# Patient Record
Sex: Female | Born: 1935 | Race: White | Hispanic: No | Smoking: Former smoker
Health system: Southern US, Community
[De-identification: ages and names within clinical notes are randomized; demographics above are authoritative.]

## PROBLEM LIST (undated history)

## (undated) DIAGNOSIS — E039 Hypothyroidism, unspecified: Secondary | ICD-10-CM

## (undated) DIAGNOSIS — I739 Peripheral vascular disease, unspecified: Secondary | ICD-10-CM

## (undated) DIAGNOSIS — G629 Polyneuropathy, unspecified: Secondary | ICD-10-CM

## (undated) DIAGNOSIS — E785 Hyperlipidemia, unspecified: Secondary | ICD-10-CM

## (undated) DIAGNOSIS — C3490 Malignant neoplasm of unspecified part of unspecified bronchus or lung: Secondary | ICD-10-CM

## (undated) DIAGNOSIS — Z9221 Personal history of antineoplastic chemotherapy: Secondary | ICD-10-CM

## (undated) DIAGNOSIS — C801 Malignant (primary) neoplasm, unspecified: Secondary | ICD-10-CM

## (undated) DIAGNOSIS — I10 Essential (primary) hypertension: Secondary | ICD-10-CM

## (undated) DIAGNOSIS — C50919 Malignant neoplasm of unspecified site of unspecified female breast: Secondary | ICD-10-CM

## (undated) DIAGNOSIS — Z923 Personal history of irradiation: Secondary | ICD-10-CM

## (undated) DIAGNOSIS — R918 Other nonspecific abnormal finding of lung field: Secondary | ICD-10-CM

## (undated) DIAGNOSIS — J3801 Paralysis of vocal cords and larynx, unilateral: Secondary | ICD-10-CM

## (undated) DIAGNOSIS — J449 Chronic obstructive pulmonary disease, unspecified: Secondary | ICD-10-CM

## (undated) HISTORY — DX: Other nonspecific abnormal finding of lung field: R91.8

## (undated) HISTORY — DX: Personal history of antineoplastic chemotherapy: Z92.21

## (undated) HISTORY — DX: Malignant neoplasm of unspecified site of unspecified female breast: C50.919

## (undated) HISTORY — DX: Malignant neoplasm of unspecified part of unspecified bronchus or lung: C34.90

## (undated) HISTORY — PX: NECK MASS EXCISION: SHX2079

## (undated) HISTORY — PX: THYROIDECTOMY: SHX17

## (undated) HISTORY — PX: ABDOMINAL HYSTERECTOMY: SHX81

## (undated) HISTORY — DX: Paralysis of vocal cords and larynx, unilateral: J38.01

## (undated) HISTORY — DX: Essential (primary) hypertension: I10

## (undated) HISTORY — DX: Peripheral vascular disease, unspecified: I73.9

## (undated) HISTORY — PX: MASTECTOMY: SHX3

## (undated) HISTORY — DX: Hyperlipidemia, unspecified: E78.5

## (undated) HISTORY — DX: Polyneuropathy, unspecified: G62.9

## (undated) HISTORY — PX: EXTERNAL EAR SURGERY: SHX627

## (undated) SURGERY — BRONCHOSCOPY, FLEXIBLE
Anesthesia: Moderate Sedation | Laterality: Bilateral

---

## 2000-07-06 ENCOUNTER — Encounter: Payer: Self-pay | Admitting: *Deleted

## 2000-07-06 ENCOUNTER — Ambulatory Visit (HOSPITAL_COMMUNITY): Admission: RE | Admit: 2000-07-06 | Discharge: 2000-07-06 | Payer: Self-pay | Admitting: *Deleted

## 2000-07-30 ENCOUNTER — Encounter: Payer: Self-pay | Admitting: General Surgery

## 2000-07-30 ENCOUNTER — Ambulatory Visit (HOSPITAL_COMMUNITY): Admission: RE | Admit: 2000-07-30 | Discharge: 2000-07-30 | Payer: Self-pay | Admitting: General Surgery

## 2000-08-15 ENCOUNTER — Encounter: Payer: Self-pay | Admitting: Internal Medicine

## 2000-08-15 ENCOUNTER — Ambulatory Visit (HOSPITAL_COMMUNITY): Admission: RE | Admit: 2000-08-15 | Discharge: 2000-08-15 | Payer: Self-pay | Admitting: Internal Medicine

## 2000-08-31 ENCOUNTER — Ambulatory Visit (HOSPITAL_COMMUNITY): Admission: RE | Admit: 2000-08-31 | Discharge: 2000-08-31 | Payer: Self-pay | Admitting: Internal Medicine

## 2001-02-21 ENCOUNTER — Other Ambulatory Visit: Admission: RE | Admit: 2001-02-21 | Discharge: 2001-02-21 | Payer: Self-pay | Admitting: General Surgery

## 2001-04-09 ENCOUNTER — Encounter: Payer: Self-pay | Admitting: Internal Medicine

## 2001-04-09 ENCOUNTER — Ambulatory Visit (HOSPITAL_COMMUNITY): Admission: RE | Admit: 2001-04-09 | Discharge: 2001-04-09 | Payer: Self-pay | Admitting: Internal Medicine

## 2001-04-11 ENCOUNTER — Other Ambulatory Visit: Admission: RE | Admit: 2001-04-11 | Discharge: 2001-04-11 | Payer: Self-pay | Admitting: General Surgery

## 2001-04-17 ENCOUNTER — Ambulatory Visit (HOSPITAL_COMMUNITY): Admission: RE | Admit: 2001-04-17 | Discharge: 2001-04-17 | Payer: Self-pay | Admitting: Internal Medicine

## 2001-04-17 ENCOUNTER — Encounter: Payer: Self-pay | Admitting: Internal Medicine

## 2001-04-25 ENCOUNTER — Encounter: Payer: Self-pay | Admitting: Internal Medicine

## 2001-04-25 ENCOUNTER — Ambulatory Visit (HOSPITAL_COMMUNITY): Admission: RE | Admit: 2001-04-25 | Discharge: 2001-04-25 | Payer: Self-pay | Admitting: Internal Medicine

## 2001-07-31 ENCOUNTER — Encounter: Payer: Self-pay | Admitting: General Surgery

## 2001-07-31 ENCOUNTER — Ambulatory Visit (HOSPITAL_COMMUNITY): Admission: RE | Admit: 2001-07-31 | Discharge: 2001-07-31 | Payer: Self-pay | Admitting: General Surgery

## 2001-10-15 ENCOUNTER — Other Ambulatory Visit: Admission: RE | Admit: 2001-10-15 | Discharge: 2001-10-15 | Payer: Self-pay | Admitting: General Surgery

## 2001-12-23 ENCOUNTER — Ambulatory Visit (HOSPITAL_COMMUNITY): Admission: RE | Admit: 2001-12-23 | Discharge: 2001-12-23 | Payer: Self-pay | Admitting: Internal Medicine

## 2001-12-23 ENCOUNTER — Encounter: Payer: Self-pay | Admitting: Internal Medicine

## 2001-12-26 ENCOUNTER — Encounter: Payer: Self-pay | Admitting: Family Medicine

## 2001-12-26 ENCOUNTER — Ambulatory Visit (HOSPITAL_COMMUNITY): Admission: RE | Admit: 2001-12-26 | Discharge: 2001-12-26 | Payer: Self-pay | Admitting: Family Medicine

## 2002-08-05 ENCOUNTER — Ambulatory Visit (HOSPITAL_COMMUNITY): Admission: RE | Admit: 2002-08-05 | Discharge: 2002-08-05 | Payer: Self-pay | Admitting: General Surgery

## 2002-08-05 ENCOUNTER — Encounter: Payer: Self-pay | Admitting: General Surgery

## 2002-08-14 ENCOUNTER — Encounter: Payer: Self-pay | Admitting: General Surgery

## 2002-08-14 ENCOUNTER — Ambulatory Visit (HOSPITAL_COMMUNITY): Admission: RE | Admit: 2002-08-14 | Discharge: 2002-08-14 | Payer: Self-pay | Admitting: General Surgery

## 2002-11-21 ENCOUNTER — Ambulatory Visit (HOSPITAL_COMMUNITY): Admission: RE | Admit: 2002-11-21 | Discharge: 2002-11-21 | Payer: Self-pay | Admitting: General Surgery

## 2002-11-21 ENCOUNTER — Encounter: Payer: Self-pay | Admitting: General Surgery

## 2003-01-28 ENCOUNTER — Ambulatory Visit (HOSPITAL_COMMUNITY): Admission: RE | Admit: 2003-01-28 | Discharge: 2003-01-28 | Payer: Self-pay | Admitting: Internal Medicine

## 2003-02-18 ENCOUNTER — Ambulatory Visit (HOSPITAL_COMMUNITY): Admission: RE | Admit: 2003-02-18 | Discharge: 2003-02-18 | Payer: Self-pay | Admitting: General Surgery

## 2003-10-06 ENCOUNTER — Ambulatory Visit (HOSPITAL_COMMUNITY): Admission: RE | Admit: 2003-10-06 | Discharge: 2003-10-06 | Payer: Self-pay | Admitting: Internal Medicine

## 2003-11-04 ENCOUNTER — Ambulatory Visit (HOSPITAL_COMMUNITY): Admission: RE | Admit: 2003-11-04 | Discharge: 2003-11-04 | Payer: Self-pay | Admitting: Internal Medicine

## 2003-11-13 ENCOUNTER — Ambulatory Visit (HOSPITAL_COMMUNITY): Admission: RE | Admit: 2003-11-13 | Discharge: 2003-11-13 | Payer: Self-pay | Admitting: Internal Medicine

## 2003-11-23 ENCOUNTER — Ambulatory Visit (HOSPITAL_COMMUNITY): Admission: RE | Admit: 2003-11-23 | Discharge: 2003-11-23 | Payer: Self-pay | Admitting: Internal Medicine

## 2004-01-22 ENCOUNTER — Observation Stay (HOSPITAL_COMMUNITY): Admission: RE | Admit: 2004-01-22 | Discharge: 2004-01-23 | Payer: Self-pay | Admitting: General Surgery

## 2004-04-07 ENCOUNTER — Ambulatory Visit (HOSPITAL_COMMUNITY): Admission: RE | Admit: 2004-04-07 | Discharge: 2004-04-07 | Payer: Self-pay | Admitting: Internal Medicine

## 2004-04-27 ENCOUNTER — Observation Stay (HOSPITAL_COMMUNITY): Admission: RE | Admit: 2004-04-27 | Discharge: 2004-04-28 | Payer: Self-pay | Admitting: General Surgery

## 2004-06-03 HISTORY — PX: DOPPLER ECHOCARDIOGRAPHY: SHX263

## 2005-09-22 ENCOUNTER — Ambulatory Visit (HOSPITAL_COMMUNITY): Admission: RE | Admit: 2005-09-22 | Discharge: 2005-09-22 | Payer: Self-pay | Admitting: General Surgery

## 2006-09-25 ENCOUNTER — Ambulatory Visit (HOSPITAL_COMMUNITY): Admission: RE | Admit: 2006-09-25 | Discharge: 2006-09-25 | Payer: Self-pay | Admitting: General Surgery

## 2006-11-20 ENCOUNTER — Ambulatory Visit (HOSPITAL_COMMUNITY): Admission: RE | Admit: 2006-11-20 | Discharge: 2006-11-20 | Payer: Self-pay | Admitting: Family Medicine

## 2007-03-14 HISTORY — PX: FEMORAL ARTERY STENT: SHX1583

## 2007-04-17 ENCOUNTER — Ambulatory Visit (HOSPITAL_COMMUNITY): Admission: RE | Admit: 2007-04-17 | Discharge: 2007-04-17 | Payer: Self-pay | Admitting: Internal Medicine

## 2007-05-01 ENCOUNTER — Ambulatory Visit (HOSPITAL_COMMUNITY): Admission: RE | Admit: 2007-05-01 | Discharge: 2007-05-01 | Payer: Self-pay | Admitting: Family Medicine

## 2007-10-17 ENCOUNTER — Ambulatory Visit (HOSPITAL_COMMUNITY): Admission: RE | Admit: 2007-10-17 | Discharge: 2007-10-17 | Payer: Self-pay | Admitting: General Surgery

## 2007-12-17 ENCOUNTER — Ambulatory Visit (HOSPITAL_COMMUNITY): Admission: RE | Admit: 2007-12-17 | Discharge: 2007-12-17 | Payer: Self-pay | Admitting: Family Medicine

## 2008-01-10 HISTORY — PX: NM MYOCAR PERF WALL MOTION: HXRAD629

## 2008-01-27 ENCOUNTER — Ambulatory Visit (HOSPITAL_COMMUNITY): Admission: RE | Admit: 2008-01-27 | Discharge: 2008-01-27 | Payer: Self-pay | Admitting: *Deleted

## 2008-02-03 ENCOUNTER — Inpatient Hospital Stay (HOSPITAL_COMMUNITY): Admission: RE | Admit: 2008-02-03 | Discharge: 2008-02-04 | Payer: Self-pay | Admitting: Cardiovascular Disease

## 2008-02-03 HISTORY — PX: LOWER EXTREMITY ANGIOGRAM: SHX5955

## 2008-10-27 ENCOUNTER — Ambulatory Visit (HOSPITAL_COMMUNITY): Admission: RE | Admit: 2008-10-27 | Discharge: 2008-10-27 | Payer: Self-pay | Admitting: Otolaryngology

## 2008-10-27 ENCOUNTER — Ambulatory Visit (HOSPITAL_COMMUNITY): Admission: RE | Admit: 2008-10-27 | Discharge: 2008-10-27 | Payer: Self-pay | Admitting: Hematology and Oncology

## 2009-04-20 ENCOUNTER — Ambulatory Visit (HOSPITAL_COMMUNITY): Admission: RE | Admit: 2009-04-20 | Discharge: 2009-04-20 | Payer: Self-pay | Admitting: Ophthalmology

## 2009-05-04 ENCOUNTER — Ambulatory Visit (HOSPITAL_COMMUNITY): Admission: RE | Admit: 2009-05-04 | Discharge: 2009-05-04 | Payer: Self-pay | Admitting: Ophthalmology

## 2009-07-12 ENCOUNTER — Inpatient Hospital Stay (HOSPITAL_COMMUNITY): Admission: EM | Admit: 2009-07-12 | Discharge: 2009-07-13 | Payer: Self-pay | Admitting: Emergency Medicine

## 2009-11-01 ENCOUNTER — Ambulatory Visit (HOSPITAL_COMMUNITY): Admission: RE | Admit: 2009-11-01 | Discharge: 2009-11-01 | Payer: Self-pay | Admitting: Family Medicine

## 2010-04-03 ENCOUNTER — Encounter: Payer: Self-pay | Admitting: General Surgery

## 2010-05-31 LAB — CULTURE, BLOOD (ROUTINE X 2)
Culture: NO GROWTH
Culture: NO GROWTH
Report Status: 5072011

## 2010-05-31 LAB — CBC
Hemoglobin: 11.6 g/dL — ABNORMAL LOW (ref 12.0–15.0)
Hemoglobin: 14.4 g/dL (ref 12.0–15.0)
MCHC: 34.2 g/dL (ref 30.0–36.0)
MCV: 87.7 fL (ref 78.0–100.0)
RBC: 3.82 MIL/uL — ABNORMAL LOW (ref 3.87–5.11)
RBC: 4.81 MIL/uL (ref 3.87–5.11)
WBC: 5.4 10*3/uL (ref 4.0–10.5)
WBC: 9 10*3/uL (ref 4.0–10.5)

## 2010-05-31 LAB — BASIC METABOLIC PANEL
BUN: 11 mg/dL (ref 6–23)
BUN: 13 mg/dL (ref 6–23)
CO2: 23 mEq/L (ref 19–32)
CO2: 24 mEq/L (ref 19–32)
Calcium: 10.7 mg/dL — ABNORMAL HIGH (ref 8.4–10.5)
Calcium: 9.2 mg/dL (ref 8.4–10.5)
Creatinine, Ser: 1.11 mg/dL (ref 0.4–1.2)
Potassium: 3.3 mEq/L — ABNORMAL LOW (ref 3.5–5.1)
Sodium: 137 mEq/L (ref 135–145)

## 2010-05-31 LAB — DIFFERENTIAL
Basophils Relative: 0 % (ref 0–1)
Basophils Relative: 0 % (ref 0–1)
Eosinophils Absolute: 0.2 10*3/uL (ref 0.0–0.7)
Eosinophils Relative: 0 % (ref 0–5)
Lymphs Abs: 0.6 10*3/uL — ABNORMAL LOW (ref 0.7–4.0)
Lymphs Abs: 1.5 10*3/uL (ref 0.7–4.0)
Neutro Abs: 8 10*3/uL — ABNORMAL HIGH (ref 1.7–7.7)
Neutrophils Relative %: 89 % — ABNORMAL HIGH (ref 43–77)

## 2010-05-31 LAB — URINE CULTURE
Colony Count: NO GROWTH
Culture: NO GROWTH

## 2010-05-31 LAB — URINALYSIS, ROUTINE W REFLEX MICROSCOPIC
Glucose, UA: NEGATIVE mg/dL
Ketones, ur: NEGATIVE mg/dL
Leukocytes, UA: NEGATIVE
Protein, ur: 30 mg/dL — AB
pH: 6.5 (ref 5.0–8.0)

## 2010-05-31 LAB — HEMOGLOBIN AND HEMATOCRIT, BLOOD
HCT: 35 % — ABNORMAL LOW (ref 36.0–46.0)
Hemoglobin: 12.1 g/dL (ref 12.0–15.0)

## 2010-05-31 LAB — POCT CARDIAC MARKERS
CKMB, poc: <1 ng/mL — ABNORMAL LOW (ref 1.0–8.0)
Myoglobin, poc: 69.2 ng/mL (ref 12–200)

## 2010-05-31 LAB — URINE MICROSCOPIC-ADD ON

## 2010-06-01 LAB — BASIC METABOLIC PANEL
BUN: 11 mg/dL (ref 6–23)
Chloride: 104 mEq/L (ref 96–112)
Creatinine, Ser: 0.75 mg/dL (ref 0.4–1.2)

## 2010-06-01 LAB — HEMOGLOBIN AND HEMATOCRIT, BLOOD: Hemoglobin: 12.6 g/dL (ref 12.0–15.0)

## 2010-07-07 ENCOUNTER — Other Ambulatory Visit (HOSPITAL_COMMUNITY): Payer: Self-pay | Admitting: Family Medicine

## 2010-07-07 ENCOUNTER — Encounter (HOSPITAL_COMMUNITY): Payer: Self-pay

## 2010-07-07 ENCOUNTER — Ambulatory Visit (HOSPITAL_COMMUNITY)
Admission: RE | Admit: 2010-07-07 | Discharge: 2010-07-07 | Disposition: A | Discharge status: Home / Self Care | Payer: Medicare Other | Source: Ambulatory Visit | Attending: Family Medicine | Admitting: Family Medicine

## 2010-07-07 DIAGNOSIS — R5383 Other fatigue: Secondary | ICD-10-CM | POA: Insufficient documentation

## 2010-07-07 DIAGNOSIS — R5381 Other malaise: Secondary | ICD-10-CM

## 2010-07-07 DIAGNOSIS — E785 Hyperlipidemia, unspecified: Secondary | ICD-10-CM

## 2010-07-07 DIAGNOSIS — R0602 Shortness of breath: Secondary | ICD-10-CM | POA: Insufficient documentation

## 2010-07-07 DIAGNOSIS — E119 Type 2 diabetes mellitus without complications: Secondary | ICD-10-CM

## 2010-07-26 NOTE — Cardiovascular Report (Signed)
NAMEDASHAE, WILCHER NO.:  [id number]   MEDICAL RECORD NO.:  [id number]          PATIENT TYPE:  INP   LOCATION:  6527                         FACILITY:  [facility]   PHYSICIAN:  Nanetta Batty, M.D.   DATE OF BIRTH:  [birth year]   DATE OF PROCEDURE:  DATE OF DISCHARGE:                            CARDIAC CATHETERIZATION   Ms. Nathanson is a delightful 77 year old Caucasian female referred to me by  Dr. Phillips Odor for claudication.  She has hypertension, hyperlipidemia, and  a family history of heart disease.  She had Dopplers performed at [facility] showed a right ABI of 0.84 with suggestion of iliac  disease.  Myoview stress test and 2-D echo were normal.  She presents  now for angiography and potential intervention.   DESCRIPTION OF PROCEDURE:  The patient was brought to the Second Floor  of [facility] in the postabsorptive state.  She was  premedicated with p.o. Valium and IV fentanyl.  Her left groin was  prepped and draped in the usual sterile fashion.  A 1% Xylocaine was  used for local anesthesia.  A 5-French sheath was inserted into left  femoral artery using standard Seldinger technique.  A 5-French tennis  racket catheter was used for abdominal aortography with bifemoral runoff  using digital subtraction bolus chase step table technique.  Visipaque  dye was used for the entirety of the case.  Retrograde aortic pressures  were monitored during the case.   ANGIOGRAPHIC RESULTS:  1. Abdominal aorta;      a.     Renal arteries - normal.      b.     Infrarenal abdominal aorta; moderate atherosclerotic       changes.  2. Left lower extremity; normal and free of significant disease.  3. Right lower extremity;      a.     95% eccentric ostial right common iliac artery stenosis with       three-vessel runoff.   DESCRIPTION OF PROCEDURE:  A 6-French 30-cm long Cordis Brite Tip sheath  was inserted into the right femoral artery using standard  Seldinger  technique.  The patient received 2500 units of heparin intravenously.  Using a 0.035 wire and 7 x 18 Genesis and OPTA balloon stent premount,  primary stenting was performed of the ostium of the right common iliac  artery.  This was then postdilated with an 82 PowerFlex up to 10  atmospheres resulting reduction of a 95% eccentric ostial right common  iliac artery stenosis to less than 10% residual.  There was mild  overhang into the origin of the left common iliac artery.  However,  this was only 1-2 mm and I felt acceptable, not requiring intervention  on the left.   IMPRESSION:  Successful PTA and stenting of a high-grade ostial right  common iliac artery stenosis with Genesis balloon expandable stent for  symptom limiting claudication.  The patient tolerated the procedure  well.  ACT was measured and both sheaths were removed.  Pressure was  used on the groin to achieve hemostasis.  The patient  left  the lab in stable condition.  She will be gently hydrated, discharged  home in the morning.  She will get followup Dopplers and ABIs in the  office when we see her back for further evaluation and followup.  Dr. Geanie Cooley was notified of these results.      Nanetta Batty, M.D.  Electronically Signed     JB/MEDQ  D:  02/03/2008  T:  02/04/2008  Job:  [id number]   cc:   Corrie Mckusick, M.D.  Second Floor of [facility]  [facility]

## 2010-07-26 NOTE — Discharge Summary (Signed)
NAMEFRANCESKA, [name] NO.:  [id number]   MEDICAL RECORD NO.:  [id number]          PATIENT TYPE:  INP   LOCATION:  6527                         FACILITY:  [facility]   PHYSICIAN:  Nanetta Batty, M.D.   DATE OF BIRTH:  [birth year]   DATE OF ADMISSION:  02/03/2008  DATE OF DISCHARGE:  02/04/2008                               DISCHARGE SUMMARY   DISCHARGE DIAGNOSES:  1. Peripheral vascular disease with claudication, status post      percutaneous transluminal angioplasty and stenting of the right      common iliac artery, this admission.  2. History of low-risk Myoview and essentially normal echocardiogram      as an outpatient.  3. Remote vancomycin.  4. History of breast cancer status post chemotherapy in the past.  5. Treated hypertension.  6. Treated dyslipidemia.  7. Gastrointestinal bleeding and arteriovenous malformations, even on      81 mg of aspirin in the past.   HOSPITAL COURSE:  Ms. Shedrick is a pleasant 77 year old female who had  actually been seen by Dr. Tresa Endo some years ago.  She quit smoking 15  years ago.  She worked in a Medical laboratory scientific officer for 20 years.  She had breast  cancer treated with chemotherapy.  She has had some GI bleeding, which  she was told secondary to AVMs in her stomach.  She says Dr. Tresa Endo had  tried her on baby aspirin and she bled on that as well, so she does not  take aspirin now.  Recently, she was referred to Dr. Allyson Sabal for  claudication.  He saw her in the office on January 07, 2008.  Dopplers  at [facility] revealed an ABI of 0.84 on the right.  Carotid suggested  mild to moderate disease, 50%.  She was admitted for elective angiogram.  This was done on February 03, 2008.  She had a normal left system with  95% ostia of right common iliac artery and 3-vessel run off.  She  underwent PTA and stenting with good results.  We felt that she can be  discharged on February 04, 2008.   LABORATORY DATA:  Sodium 139, potassium 4.0, BUN 11, and  creatinine  0.88.  White count 6.3, hemoglobin 12.2, hematocrit 35.1, and platelets  163.   DISCHARGE MEDICATIONS:  1. Zocor 80 mg a day.  2. Prevacid 30 mg, we have asked her to now take this at night.  3. Plavix 75 mg, which she will take in the morning.  4. Synthroid 0.05 mg a day.  5. Fish oil 1 g a day.  6. Vitamin D.   DISPOSITION:  The patient is discharged in stable condition.  She will  talk to her doctor about her Prevacid, she states she is having trouble  affording this.  I suggested strongly that she stay on PPI or an H2  blocker and she could work this out with her family doctor.  If she does  stay on a PPI, we have been recommending that they be taken 12 hours  apart from her Plavix.  Abelino Derrick, P.A.      Nanetta Batty, M.D.  Electronically Signed    LKK/MEDQ  D:  02/04/2008  T:  02/05/2008  Job:  [id number]   cc:   Corrie Mckusick, M.D.

## 2010-07-29 NOTE — Op Note (Signed)
NAMEKYLEN, ISMAEL NO.:  [id number]   MEDICAL RECORD NO.:  [id number]          PATIENT TYPE:  AMB   LOCATION:  DAY                           FACILITY:  [facility]   PHYSICIAN:  Barbaraann Barthel, M.D. DATE OF BIRTH:  1933/11/02   DATE OF PROCEDURE:  04/27/2004  DATE OF DISCHARGE:                                 OPERATIVE REPORT   DIAGNOSIS:  Hyperparathyroidism.   PROCEDURE:  Left thyroid lobectomy.   NOTE:  This is a 77 year old white female who had hypercalcemia and a  hyperparathyroid hormone elevation, who underwent months ago and a left  lower  parathyroid adenoma removal.  She, however, on rescanning her  she  continued to have hypercalcemia, was noted to have the same images before  with findings still of the left lower lobe pole of the thyroid.  We  discussed this with the endocrinologist and planned for a left thyroid  lobectomy to remove this.  Also discussed this preoperatively with the  pathologist and planned for an excision for permanent section as this would  be the removal of the entire left lobe of the thyroid.  Informed consent was  obtained.  We discussed complications not limited to but including bleeding,  infection, problems with calcium metabolism and the possibility that further  surgery might be required, as well as discussed the possible damage to the  recurrent laryngeal nerve.  Informed consent was obtained.   SPECIMEN:  Left thyroid lobe.   GROSS OPERATIVE FINDINGS:  The patient had some scarring from the previous  surgery.  She had a very atrophic thyroid gland.  She has hypothyroidism and  has been on thyroid replacement for some time.  We identified the superior  and the lower pole of thyroid.  We stayed medial to avoid any damage to the  recurrent laryngeal nerve.  There were no abnormalities encountered.  A  superior parathyroid gland was identified and this was within the thyroid  specimen, and a portion of it remainder  and a  portion of it was removed  with the specimen.  The specimen was marked with a silk suture for the  superior pole of the thyroid gland.   TECHNIQUE:  The patient was placed in supine position with the patient in  some hyperextension, and incision was carried out over the previous collar  incision.  Superior and inferior flaps were raised above the thyroid  cartilage into the manubrium and then the with careful dissection, the strap  muscles were dissected free from the thyroid gland and retracted laterally  with a Green retractor.  I did not feel it necessary to divide this.  This  was a small, thin neck with a very small, atrophic thyroid gland.  The superior lobe was identified.  This was ligated with 2-0 silk and  clipped as well as was the inferior pole of the thyroid gland.  We then  swept the gland from medial to lateral carefully to remain high up on the  gland to avoid any problems with the recurrent laryngeal nerve.  We ligated  different branches  of the superior thyroid artery as it entered the thyroid  gland with 2-0 and 4-0 silk as needed.  After removing this and checking for  hemostasis, this was sent in formalin as previously discussed with the  pathologist.  We checked for hemostasis.  The wound was then irrigated.  The  midline incision was closed with a 3-0 Polysorb suture.  I elected to leave  a Jackson-Pratt drain that exited through the right portion of the neck  through the midline cervical fascia closure.  The platysma and the  subcutaneous layer was closed with 4-0 Polysorb, and clips were used  approximate the skin.  The drain was sutured in place with 4-0 nylon.  Prior to closure all sponge,  needle counts were found to be correct.  Estimated blood loss minimal.  The  patient received 900 mL crystalloids intraoperatively.  There were no complications.      WB/MEDQ  D:  04/27/2004  T:  04/27/2004  Job:  [id number]   cc:   Sherrie Mustache, M.D.   Madelin Rear.  Sherwood Gambler, MD  [address]  [place]  [place] [address]  Fax: [phone]

## 2010-07-29 NOTE — Op Note (Signed)
NAME:  [name], [name]                           ACCOUNT NO.:  [id number]   MEDICAL RECORD NO.:  [id number]                   PATIENT TYPE:  AMB   LOCATION:  DAY                                  FACILITY:  [facility]   PHYSICIAN:  Lionel December, M.D.                 DATE OF BIRTH:  [birth year]   DATE OF PROCEDURE:  11/04/2003  DATE OF DISCHARGE:                                 OPERATIVE REPORT   PROCEDURE:  Esophagogastroduodenoscopy with esophageal dilation.   INDICATIONS:  [name] is a 77 year old Caucasian female with chronic GERD who  has been maintained on PPI who is having solid food dysphagia. She also  complains of frequent need to clear her throat. She is undergoing diagnostic  and therapeutic EKG. Procedure risks were reviewed with the patient and  informed consent was obtained.   PREOPERATIVE MEDICATIONS:  Cetacaine spray for pharyngeal topical  anesthesia, Demerol 25 mg IV, Versed 5 IV mg in divided doses.   FINDINGS:  Procedure performed in endoscopy suite. The patient's vital signs  and O2 saturations were monitored during the procedure and remained stable.  The patient was placed in left lateral position, and Olympus video scope was  passed via oropharynx without any difficulty into the esophagus.   Esophagus:  Mucosa of the esophagus normal. There was a noncritical ring at  the GE junction, 3-cm sized sliding hiatal hernia. Gastric mucosa of the Z  line was erythematous.   Stomach:  It was empty and distended very well with insufflation. Folds of  the proximal stomach were normal. Examination of the mucosa revealed some  antral erythema and scarring but no erosions or ulcers noted. Pyloric  channel was patent. Angularis, fundus and cardia were examined by  retroflexing the scope and were normal.   Duodenum:  Examination of the bulb reveals normal mucosa. Scope was passed  to the second part of the duodenum, and mucosa and folds were normal.  Endoscope was withdrawn.   Esophagus was dilated by passing 56-French Maloney dilator to full  insertion. As the dilator was withdrawn, endoscope was passed again, and the  ring was still intact and therefore disrupted with focal biopsy at three  different sites. Pictures taken for the record. Endoscope was withdrawn. The  patient tolerated the procedure well.   FINAL DIAGNOSES:  1. Noncritical distal esophageal ring and a small sliding hiatal hernia.     Esophagus dilated by passing 56-French Maloney dilator but ring was still     intact and therefore disrupted with focal biopsy.  2. Antrum erythema with scarring but no evidence of peptic ulcer disease.     Please note that she has previously been treated for helicobacter pylori.   RECOMMENDATIONS:  She will continue anti-reflux measures and Prevacid as  before. She will call the office with full progress report next week.      ___________________________________________  Lionel December, M.D.   NR/MEDQ  D:  11/04/2003  T:  11/04/2003  Job:  [id number]   cc:   Madelin Rear. Sherwood Gambler, M.D.  [address]  [place]  [place] [address]  Fax: [phone]

## 2010-07-29 NOTE — H&P (Signed)
NAME:  [name], [name]                           ACCOUNT NO.:  [id number]   MEDICAL RECORD NO.:  [id number]                   PATIENT TYPE:   LOCATION:                                       FACILITY:   PHYSICIAN:  Lionel December, M.D.                 DATE OF BIRTH:  1933-07-31   DATE OF ADMISSION:  DATE OF DISCHARGE:                                HISTORY & PHYSICAL   REASON FOR ADMISSION:  Dysphagia, frequent throat clearing, for EGD.   HISTORY OF PRESENT ILLNESS:  [name] is a 77 year old Caucasian female,  patient of [name], who was last seen October 2002. She has history of  GI bleed secondary to AVM as well as chronic gastroesophageal reflux  disease. She notes over the last 2 or 3 months frequent throat clearing as  well as some dysphonia including laryngitis. She is also complaining of  dysphagia with some coughing to both solids and liquids. She denies any  odynophagia. She also reports occasional heartburn and indigestion. She has  been on PPI therapy for at least 10 years that she can remember. She is  currently on Prevacid 30 mg daily. She denies any nausea or vomiting. She  denies any lower abdominal pain. She reports her bowel movements have been  normal, soft, and brown without any melena or rectal bleeding. On August 31, 2000, she underwent colonoscopy and EGD by [name]. She was found to have  antral gastritis with 3 AVMs at the duodenum, 1 at the bulb and 2 at the  second part of the duodenum. They were ablated with the help of heater  probe. Colonoscopy revealed a single cecal erosion that was otherwise  normal. She was found to be helicobacter pylori positive. She is status post  triple drug therapy. She did have significant anemia with hemoglobin in the  7 range with this episode.   PAST MEDICAL HISTORY:  1. Stage 1 infiltrating ductal carcinoma, ER/PR negative, status post     modified radical mastectomy. She completed chemotherapy as well.  2. She does  have history of syncope in the past of unknown etiology.  3. She has had multiple ear surgeries including tympanoplasty as well as     several mastoid surgeries.  4. Modified right radial mastectomy.  5. Hysterectomy.  6. Peptic ulcer disease.  7. AVMs as described in the HPI.  8. Benign right sided neck tumor.  9. Carpal tunnel release on the right.  10.      Hyperlipidemia.  11.      Erosive reflux esophagitis.  12.      Small hiatal hernia.  13.      Hypothyroidism diagnosed in 2002 currently on Synthroid.  14.      Tonsillectomy.   CURRENT MEDICATIONS:  1. Synthroid 50 mcg daily.  2. Prevacid 30 mg daily.  3. Zocor 20 mg daily.  ALLERGIES:  She does not take ASPIRIN given her history of peptic ulcer  disease. SULFA causes swelling, and NEOSPORIN causes rash.   FAMILY HISTORY:  Both mother age 62 and father age 30 deceased secondary to  coronary artery disease. She had total of 17 siblings with history  significant for liver disease and breast carcinoma as well as two brothers  with gastric carcinoma.   SOCIAL HISTORY:  [name] is divorced and lives alone. She is retired from  [employer]. She denies any alcohol or drug use. She does report 20-pack-year  tobacco use history, quitting 14 years ago.   REVIEW OF SYSTEMS:  CONSTITUTIONAL:  Weight stable. Appetite is okay. Denies  any fever or chills. CARDIOVASCULAR:  She is followed by [name]. Denies  any chest pain or palpitations at this time. PULMONARY:  She does have  frequent nonproductive cough. Denies any shortness of breath or dyspnea on  exertion. MUSCULOSKELETAL:  She is complaining of bony pain has recently  underwent a bone scan per her report given her history of breast carcinoma.  NEUROLOGICAL:  She does report tremors, especially of her head, in the last  several years although she denies any headache or dizziness. She does report  occasional vertigo, especially when she makes quick movements and feels that   this is given the fact that she has had multiple ear surgeries.  GASTROINTESTINAL:  See HPI.   PHYSICAL EXAMINATION:  VITAL SIGNS:  Weight 153 pounds, blood pressure  110/78, pulse 74.  GENERAL:  [name] is a 77 year old, well-developed, well-nourished,  Caucasian female in no acute distress. She is alert, oriented, pleasant, and  cooperative.  HEENT:  Sclerae are clear and nonicteric. Conjunctivae pink. Oropharynx pink  and moist without any lesions.  NECK:  Supple without any mass or thyromegaly.  HEART:  Regular rate and rhythm without any murmurs, clicks, rubs, or  gallops.  LUNGS:  Clear to auscultation bilaterally.  ABDOMEN:  Positive bowel sounds x4. No bruits auscultated. Soft, nontender,  nondistended, without palpable mass or hepatosplenomegaly. No rebound  tenderness or guarding.  RECTAL:  Deferred.  EXTREMITIES:  Good pedal pulses bilaterally. No edema.  SKIN:  Pink, warm, and dry without any rash or jaundice.   ASSESSMENT:  [name] is a 77 year old Caucasian female with history of  chronic GERD who now presents with frequent throat clearing as well as  dysphagia to both solids and liquids and frequent episodes of coughing  spells. She does have significant history of GI bleeding with history of  both peptic ulcer disease as well as AVMs. She is status post triple drug  therapy for positive helicobacter pylori. I feel that her upper GI tract  should be reassessed at this time as she may be having refractory GERD  symptoms. Her dysphagia may be caused by ring or stricture, although  ultimately esophageal carcinoma needs to be ruled out given her symptoms.   RECOMMENDATIONS:  1. Will schedule EGD with possible esophageal dilatation in the near future.     I have discussed this procedure including risks and benefits which     include but are not limited to bleeding, infection, perforation, and drug    reaction. She agrees with this plan, and consent will be obtained.   2. She is to continue Prevacid 30 mg daily for now although this ultimately     may need to be changed if it is poorly     controlled reflux.  3. Further recommendations pending  procedure.   We would like to thank Dr. Sherwood Gambler for allowing [place] to participate in the care  of Ms. Minteer.     _____________________________________  ___________________________________________  Nicholas Lose, N.P.               Lionel December, M.D.   KC/MEDQ  D:  10/28/2003  T:  10/28/2003  Job:  [id number]   cc:   Madelin Rear. Sherwood Gambler, M.D.  [address]  [place]  [place] [address]  Fax: [phone]

## 2010-07-29 NOTE — Op Note (Signed)
NAMECURRY, DULSKI NO.:  [id number]   MEDICAL RECORD NO.:  [id number]          PATIENT TYPE:  OBV   LOCATION:  A302                          FACILITY:  [facility]   PHYSICIAN:  Barbaraann Barthel, M.D. DATE OF BIRTH:  1933-08-02   DATE OF PROCEDURE:  01/22/2004  DATE OF DISCHARGE:                                 OPERATIVE REPORT   PREOPERATIVE DIAGNOSIS:  Hyperparathyroidism secondary to left lower  parathyroid adenoma.   POSTOPERATIVE DIAGNOSIS:  Hyperparathyroidism secondary to left lower  parathyroid adenoma.   SURGEON:  Barbaraann Barthel, M.D.   ASSISTANT:  Dirk Dress. Katrinka Blazing, M.D.   NOTE:  This is a 77 year old white female who had elevated calcium levels,  was worked up by the endocrine service and found on subtraction scan to have  left-sided findings suggestive of a parathyroid adenoma. She had also an  elevated parathyroid hormone level.  Calcium levels preoperatively were just  above normal, and her calcium level preoperatively was 10.8.  Phosphorus  level was within normal limits.   GROSS OPERATIVE FINDINGS:  The patient had a parathyroid adenoma that  appeared to be on the left lower pole of the thyroid.  We explored this very  carefully.  We marked the superior parathyroid gland as well with a clip and  removed what we thought was the parathyroid adenoma.  This was sent for  frozen section at an adjacent hospital, as our pathologist was not  available.   We also explored the contralateral side and identified what appeared to be  parathyroid glands on this side as well.  We marked with a clip the area of  the superior parathyroid gland on the left side and marked with a clip the  area where we removed the parathyroid adenoma on the left side as well.  The  thyroid gland itself was atrophic.  There was no accompanying cervical  adenopathy.   TECHNIQUE:  The patient was placed in the supine position with  hyperextension of the neck.  Her neck was placed  on a bean bag, carefully  supporting the cervical spine.  The neck area from the chin down to the  superior chest was prepped with Betadine solution and draped in the usual  manner.  Cloth drapes were used and sutured in place, and an incision, a  collar incision was made about two fingerbreadths above the manubrium.  Skin, subcutaneous tissue, and the platysmal layer were incised and superior  and inferior flaps were raised.  The cervical fascia was then opened in the  midline and then we dissected the strap muscles free on the left side,  elevating these in order to allow exploration of the gland.  We encountered  the inferior parathyroid artery and around this area there was what appeared  to be parathyroid tissue, and this was clipped and sent for frozen section.  We also examined above what appeared to be a parathyroid gland superiorly  and clipped its position as well.  We explored carefully the area around the  lower pole elevating the thyroid gland and retracting it medially and  as  well as dissecting down in the prethymic fat area.  We did not find any  other abnormalities in that area and after this was accomplished, we then  closed the fascia in the midline, placing a Penrose drain below the left  strap muscles.  Skin was approximated with  stapling device after suturing the drain in place.  As stated, we identified  the areas of the parathyroid on the left side with clips as well as  carefully identified the recurrent laryngeal nerve on this side, which I  neglected to mention.   CC: __________     Marsh Dolly  D:  01/22/2004  T:  01/22/2004  Job:  [id number]   cc:   Dr. Drue Novel C. Katrinka Blazing, M.D.  [address]  [place]  [place] [address]  Fax: [phone]

## 2010-09-26 ENCOUNTER — Other Ambulatory Visit (HOSPITAL_COMMUNITY): Payer: Self-pay | Admitting: General Surgery

## 2010-09-26 DIAGNOSIS — Z139 Encounter for screening, unspecified: Secondary | ICD-10-CM

## 2010-11-07 ENCOUNTER — Ambulatory Visit (HOSPITAL_COMMUNITY)
Admission: RE | Admit: 2010-11-07 | Discharge: 2010-11-07 | Disposition: A | Discharge status: Home / Self Care | Payer: Medicare Other | Source: Ambulatory Visit | Attending: General Surgery | Admitting: General Surgery

## 2010-11-07 DIAGNOSIS — Z139 Encounter for screening, unspecified: Secondary | ICD-10-CM

## 2010-11-07 DIAGNOSIS — Z1231 Encounter for screening mammogram for malignant neoplasm of breast: Secondary | ICD-10-CM | POA: Insufficient documentation

## 2010-11-16 ENCOUNTER — Ambulatory Visit (HOSPITAL_COMMUNITY)
Admission: RE | Admit: 2010-11-16 | Discharge: 2010-11-16 | Disposition: A | Discharge status: Home / Self Care | Payer: Medicare Other | Source: Ambulatory Visit | Attending: Internal Medicine | Admitting: Internal Medicine

## 2010-11-16 ENCOUNTER — Other Ambulatory Visit (HOSPITAL_COMMUNITY): Payer: Self-pay | Admitting: Internal Medicine

## 2010-11-16 DIAGNOSIS — R918 Other nonspecific abnormal finding of lung field: Secondary | ICD-10-CM | POA: Insufficient documentation

## 2010-11-16 DIAGNOSIS — J209 Acute bronchitis, unspecified: Secondary | ICD-10-CM

## 2010-11-16 DIAGNOSIS — B9789 Other viral agents as the cause of diseases classified elsewhere: Secondary | ICD-10-CM

## 2010-11-16 DIAGNOSIS — R059 Cough, unspecified: Secondary | ICD-10-CM | POA: Insufficient documentation

## 2010-11-16 DIAGNOSIS — R0602 Shortness of breath: Secondary | ICD-10-CM | POA: Insufficient documentation

## 2010-11-16 DIAGNOSIS — R05 Cough: Secondary | ICD-10-CM | POA: Insufficient documentation

## 2010-12-13 LAB — BASIC METABOLIC PANEL
CO2: 28
Chloride: 108
Creatinine, Ser: 0.88
GFR calc Af Amer: >60
Potassium: 4
Sodium: 139

## 2010-12-13 LAB — CBC
HCT: 35.5 — ABNORMAL LOW
Hemoglobin: 12.2
MCHC: 34.5
MCV: 87.9
RBC: 4.03

## 2011-02-23 ENCOUNTER — Other Ambulatory Visit (HOSPITAL_COMMUNITY): Payer: Self-pay | Admitting: Family Medicine

## 2011-02-23 DIAGNOSIS — Z139 Encounter for screening, unspecified: Secondary | ICD-10-CM

## 2011-03-01 ENCOUNTER — Ambulatory Visit (HOSPITAL_COMMUNITY)
Admission: RE | Admit: 2011-03-01 | Discharge: 2011-03-01 | Disposition: A | Discharge status: Home / Self Care | Payer: Medicare Other | Source: Ambulatory Visit | Attending: Family Medicine | Admitting: Family Medicine

## 2011-03-01 DIAGNOSIS — M899 Disorder of bone, unspecified: Secondary | ICD-10-CM | POA: Insufficient documentation

## 2011-03-01 DIAGNOSIS — M949 Disorder of cartilage, unspecified: Secondary | ICD-10-CM | POA: Insufficient documentation

## 2011-03-01 DIAGNOSIS — Z780 Asymptomatic menopausal state: Secondary | ICD-10-CM | POA: Insufficient documentation

## 2011-03-01 DIAGNOSIS — Z139 Encounter for screening, unspecified: Secondary | ICD-10-CM

## 2011-03-24 ENCOUNTER — Ambulatory Visit (HOSPITAL_COMMUNITY)
Admission: RE | Admit: 2011-03-24 | Discharge: 2011-03-24 | Disposition: A | Discharge status: Home / Self Care | Payer: Medicare Other | Source: Ambulatory Visit | Attending: Pulmonary Disease | Admitting: Pulmonary Disease

## 2011-03-24 DIAGNOSIS — R0602 Shortness of breath: Secondary | ICD-10-CM | POA: Insufficient documentation

## 2011-03-24 LAB — PULMONARY FUNCTION TEST

## 2011-03-24 LAB — BLOOD GAS, ARTERIAL
Acid-Base Excess: 0.8 mmol/L (ref 0.0–2.0)
Bicarbonate: 24.4 mEq/L — ABNORMAL HIGH (ref 20.0–24.0)
O2 Saturation: 98.2 %
TCO2: 21.5 mmol/L (ref 0–100)
pO2, Arterial: 100 mmHg (ref 80.0–100.0)

## 2011-03-24 MED ORDER — ALBUTEROL SULFATE (5 MG/ML) 0.5% IN NEBU
2.5000 mg | INHALATION_SOLUTION | Freq: Once | RESPIRATORY_TRACT | Status: AC
  Administered 2011-03-24: 2.5 mg via RESPIRATORY_TRACT

## 2011-03-27 NOTE — Procedures (Signed)
NAMEMADISUN, HARGROVE NO.:  [id number]  MEDICAL RECORD NO.:  [id number]  LOCATION:                                 FACILITY:  PHYSICIAN:  Deep Bonawitz L. Juanetta Gosling, M.D.DATE OF BIRTH:  [birth year]  DATE OF PROCEDURE: DATE OF DISCHARGE:                           PULMONARY FUNCTION TEST   Reason for pulmonary function testing is COPD.  1. Spirometry shows higher than predicted FVC and FEV1.  There is     evidence of airflow obstruction, however. 2. Lung volumes show air trapping. 3. DLCO is normal. 4. Arterial blood gas is normal. 5. There is no significant bronchodilator improvement.     Roe Koffman L. Juanetta Gosling, M.D.     ELH/MEDQ  D:  03/27/2011  T:  03/27/2011  Job:  [id number]

## 2011-03-28 ENCOUNTER — Other Ambulatory Visit (HOSPITAL_COMMUNITY): Payer: Self-pay

## 2011-08-18 HISTORY — PX: OTHER SURGICAL HISTORY: SHX169

## 2011-10-30 ENCOUNTER — Encounter (HOSPITAL_COMMUNITY): Payer: Self-pay | Admitting: Emergency Medicine

## 2011-10-30 ENCOUNTER — Ambulatory Visit (HOSPITAL_COMMUNITY)
Admission: RE | Admit: 2011-10-30 | Discharge: 2011-10-30 | Disposition: A | Discharge status: Home / Self Care | Payer: Medicare Other | Source: Ambulatory Visit | Attending: Family Medicine | Admitting: Family Medicine

## 2011-10-30 ENCOUNTER — Observation Stay (HOSPITAL_COMMUNITY)
Admission: EM | Admit: 2011-10-30 | Discharge: 2011-11-01 | Disposition: A | Discharge status: Home / Self Care | Payer: Medicare Other | Attending: Internal Medicine | Admitting: Internal Medicine

## 2011-10-30 ENCOUNTER — Other Ambulatory Visit (HOSPITAL_COMMUNITY): Payer: Self-pay | Admitting: Family Medicine

## 2011-10-30 ENCOUNTER — Emergency Department (HOSPITAL_COMMUNITY): Payer: Medicare Other

## 2011-10-30 DIAGNOSIS — R0989 Other specified symptoms and signs involving the circulatory and respiratory systems: Secondary | ICD-10-CM | POA: Insufficient documentation

## 2011-10-30 DIAGNOSIS — C50919 Malignant neoplasm of unspecified site of unspecified female breast: Secondary | ICD-10-CM | POA: Insufficient documentation

## 2011-10-30 DIAGNOSIS — C3490 Malignant neoplasm of unspecified part of unspecified bronchus or lung: Secondary | ICD-10-CM | POA: Diagnosis present

## 2011-10-30 DIAGNOSIS — R0609 Other forms of dyspnea: Secondary | ICD-10-CM | POA: Insufficient documentation

## 2011-10-30 DIAGNOSIS — J4489 Other specified chronic obstructive pulmonary disease: Secondary | ICD-10-CM | POA: Insufficient documentation

## 2011-10-30 DIAGNOSIS — J9819 Other pulmonary collapse: Principal | ICD-10-CM

## 2011-10-30 DIAGNOSIS — J449 Chronic obstructive pulmonary disease, unspecified: Secondary | ICD-10-CM

## 2011-10-30 DIAGNOSIS — C801 Malignant (primary) neoplasm, unspecified: Secondary | ICD-10-CM

## 2011-10-30 DIAGNOSIS — R109 Unspecified abdominal pain: Secondary | ICD-10-CM

## 2011-10-30 DIAGNOSIS — R1032 Left lower quadrant pain: Secondary | ICD-10-CM

## 2011-10-30 DIAGNOSIS — R918 Other nonspecific abnormal finding of lung field: Secondary | ICD-10-CM

## 2011-10-30 DIAGNOSIS — Z23 Encounter for immunization: Secondary | ICD-10-CM | POA: Insufficient documentation

## 2011-10-30 DIAGNOSIS — R222 Localized swelling, mass and lump, trunk: Secondary | ICD-10-CM | POA: Insufficient documentation

## 2011-10-30 HISTORY — DX: Malignant (primary) neoplasm, unspecified: C80.1

## 2011-10-30 HISTORY — DX: Chronic obstructive pulmonary disease, unspecified: J44.9

## 2011-10-30 LAB — BASIC METABOLIC PANEL
BUN: 13 mg/dL (ref 6–23)
CO2: 26 mEq/L (ref 19–32)
GFR calc non Af Amer: 81 mL/min — ABNORMAL LOW (ref >90)
Glucose, Bld: 84 mg/dL (ref 70–99)
Potassium: 3.8 mEq/L (ref 3.5–5.1)

## 2011-10-30 LAB — CBC WITH DIFFERENTIAL/PLATELET
Eosinophils Absolute: 0.2 10*3/uL (ref 0.0–0.7)
Eosinophils Relative: 4 % (ref 0–5)
Hemoglobin: 12.7 g/dL (ref 12.0–15.0)
Lymphocytes Relative: 30 % (ref 12–46)
Lymphs Abs: 2 10*3/uL (ref 0.7–4.0)
MCH: 28.9 pg (ref 26.0–34.0)
MCV: 87.7 fL (ref 78.0–100.0)
Monocytes Relative: 7 % (ref 3–12)
RBC: 4.4 MIL/uL (ref 3.87–5.11)
WBC: 6.7 10*3/uL (ref 4.0–10.5)

## 2011-10-30 LAB — HEPATIC FUNCTION PANEL
ALT: 10 U/L (ref 0–35)
Alkaline Phosphatase: 74 U/L (ref 39–117)
Total Bilirubin: 0.3 mg/dL (ref 0.3–1.2)
Total Protein: 6.5 g/dL (ref 6.0–8.3)

## 2011-10-30 MED ORDER — PNEUMOCOCCAL VAC POLYVALENT 25 MCG/0.5ML IJ INJ
0.5000 mL | INJECTION | INTRAMUSCULAR | Status: AC
  Administered 2011-10-31: 0.5 mL via INTRAMUSCULAR
  Filled 2011-10-30: qty 0.5

## 2011-10-30 MED ORDER — CLOPIDOGREL BISULFATE 75 MG PO TABS
75.0000 mg | ORAL_TABLET | Freq: Every evening | ORAL | Status: DC
  Administered 2011-10-30 – 2011-11-01 (×3): 75 mg via ORAL
  Filled 2011-10-30 (×3): qty 1

## 2011-10-30 MED ORDER — CIPROFLOXACIN HCL 250 MG PO TABS
500.0000 mg | ORAL_TABLET | Freq: Two times a day (BID) | ORAL | Status: DC
  Administered 2011-10-30 – 2011-11-01 (×4): 500 mg via ORAL
  Filled 2011-10-30 (×4): qty 2

## 2011-10-30 MED ORDER — BIOTENE DRY MOUTH MT LIQD
15.0000 mL | Freq: Two times a day (BID) | OROMUCOSAL | Status: DC
  Administered 2011-10-31 – 2011-11-01 (×3): 15 mL via OROMUCOSAL

## 2011-10-30 MED ORDER — IOHEXOL 300 MG/ML  SOLN
100.0000 mL | Freq: Once | INTRAMUSCULAR | Status: AC | PRN
  Administered 2011-10-30: 100 mL via INTRAVENOUS

## 2011-10-30 MED ORDER — SODIUM CHLORIDE 0.9 % IV SOLN
INTRAVENOUS | Status: DC
Start: 2011-10-30 — End: 2011-10-30
  Administered 2011-10-30: 19:00:00 via INTRAVENOUS

## 2011-10-30 MED ORDER — IOHEXOL 300 MG/ML  SOLN: 100.0000 mL | Freq: Once | INTRAMUSCULAR | Status: DC | PRN

## 2011-10-30 MED ORDER — LEVOTHYROXINE SODIUM 75 MCG PO TABS
75.0000 ug | ORAL_TABLET | ORAL | Status: DC
  Administered 2011-10-31 – 2011-11-01 (×2): 75 ug via ORAL
  Filled 2011-10-30 (×2): qty 1

## 2011-10-30 MED ORDER — ONDANSETRON HCL 4 MG/2ML IJ SOLN: 4.0000 mg | Freq: Four times a day (QID) | INTRAMUSCULAR | Status: DC | PRN

## 2011-10-30 MED ORDER — TIOTROPIUM BROMIDE MONOHYDRATE 18 MCG IN CAPS
18.0000 ug | ORAL_CAPSULE | RESPIRATORY_TRACT | Status: DC
  Administered 2011-10-31 – 2011-11-01 (×2): 18 ug via RESPIRATORY_TRACT
  Filled 2011-10-30: qty 5

## 2011-10-30 MED ORDER — ONDANSETRON HCL 4 MG/2ML IJ SOLN
4.0000 mg | Freq: Once | INTRAMUSCULAR | Status: AC
  Administered 2011-10-30: 4 mg via INTRAVENOUS
  Filled 2011-10-30: qty 2

## 2011-10-30 MED ORDER — BUDESONIDE-FORMOTEROL FUMARATE 160-4.5 MCG/ACT IN AERO
INHALATION_SPRAY | RESPIRATORY_TRACT | Status: AC
  Filled 2011-10-30: qty 6

## 2011-10-30 MED ORDER — ONDANSETRON HCL 4 MG PO TABS: 4.0000 mg | ORAL_TABLET | Freq: Four times a day (QID) | ORAL | Status: DC | PRN

## 2011-10-30 MED ORDER — MORPHINE SULFATE 2 MG/ML IJ SOLN
2.0000 mg | Freq: Once | INTRAMUSCULAR | Status: AC
  Administered 2011-10-30: 2 mg via INTRAVENOUS
  Filled 2011-10-30: qty 1

## 2011-10-30 MED ORDER — KETOROLAC TROMETHAMINE 30 MG/ML IJ SOLN: 30.0000 mg | Freq: Once | INTRAMUSCULAR | Status: DC

## 2011-10-30 MED ORDER — ALBUTEROL SULFATE (5 MG/ML) 0.5% IN NEBU: 2.5000 mg | INHALATION_SOLUTION | RESPIRATORY_TRACT | Status: DC | PRN

## 2011-10-30 MED ORDER — BUDESONIDE-FORMOTEROL FUMARATE 160-4.5 MCG/ACT IN AERO
2.0000 | INHALATION_SPRAY | Freq: Two times a day (BID) | RESPIRATORY_TRACT | Status: DC
  Administered 2011-10-31 – 2011-11-01 (×3): 2 via RESPIRATORY_TRACT
  Filled 2011-10-30: qty 6

## 2011-10-30 MED ORDER — SODIUM CHLORIDE 0.9 % IV SOLN
INTRAVENOUS | Status: AC
  Administered 2011-10-30 – 2011-10-31 (×2): via INTRAVENOUS

## 2011-10-30 NOTE — ED Notes (Signed)
Pt c/o left sided abdominal pain that radiates up her side and to her chest. Pt also c/o SOB but states "that is normal because I have COPD". Pt denies NVD.

## 2011-10-30 NOTE — H&P (Signed)
PCP:   Colette Ribas, MD   Chief Complaint:  abd pain  HPI: 78 yo female with with 4 day h/o left flank pain that radiates to the llq.  Has seen pcp and says they cked her urine which was normal but she was placed on cipro. Sent to hosp by pcp for ct abd/pelvis which incidentally showed a collapsed ll lung with ??central mass.  Xsmoker.  H/o breast cancer.  Denies any fevers/cough.  Some nausea associated with her abd pain but no vomiting or diarrhea.  Was dx with copd about a year ago and says she has had wt loss over the last year since that dx. No dysuria or hematuria. No cp, some mild sob.  Review of Systems:  O/w neg  Past Medical History: Past Medical History  Diagnosis Date  . COPD (chronic obstructive pulmonary disease)   . Cancer     breast   History reviewed. No pertinent past surgical history.  Medications: Prior to Admission medications   Medication Sig Start Date End Date Taking? Authorizing Provider  albuterol (PROVENTIL) (2.5 MG/3ML) 0.083% nebulizer solution Take 2.5 mg by nebulization every 4 (four) hours as needed.   Yes Historical Provider, MD  budesonide-formoterol (SYMBICORT) 160-4.5 MCG/ACT inhaler Inhale 2 puffs into the lungs 2 (two) times daily.   Yes Historical Provider, MD  Cholecalciferol (VITAMIN D PO) Take 1 tablet by mouth every morning.   Yes Historical Provider, MD  ciprofloxacin (CIPRO) 500 MG tablet Take 500 mg by mouth 2 (two) times daily. For 7 days 10/30/11  Yes Historical Provider, MD  clopidogrel (PLAVIX) 75 MG tablet Take 75 mg by mouth every evening.    Yes Historical Provider, MD  Docusate Calcium (STOOL SOFTENER PO) Take 1 capsule by mouth every morning.   Yes Historical Provider, MD  fish oil-omega-3 fatty acids 1000 MG capsule Take 1 g by mouth every morning.   Yes Historical Provider, MD  levothyroxine (SYNTHROID, LEVOTHROID) 75 MCG tablet Take 75 mcg by mouth every morning.    Yes Historical Provider, MD  mometasone (NASONEX) 50  MCG/ACT nasal spray Place 2 sprays into the nose daily.   Yes Historical Provider, MD  polyethylene glycol powder (GLYCOLAX/MIRALAX) powder Take 17 g by mouth daily.   Yes Historical Provider, MD  rosuvastatin (CRESTOR) 20 MG tablet Take 20 mg by mouth every evening.   Yes Historical Provider, MD  tiotropium (SPIRIVA) 18 MCG inhalation capsule Place 18 mcg into inhaler and inhale every morning.    Yes Historical Provider, MD    Allergies:   Allergies  Allergen Reactions  . Sulfa Antibiotics Hives and Swelling    REACTION: Severe swelling and hives. Patient was hospitalized  . Aspirin     CAN'T TAKE DUE TO BLEEDING IN STOMACH  . Codeine Other (See Comments)    REACTION: Fainting  . Neosporin (Neomycin-Bacitracin Zn-Polymyx)     Social History:  does not have a smoking history on file. She does not have any smokeless tobacco history on file. Her alcohol and drug histories not on file.  Family History: History reviewed. No pertinent family history.  Physical Exam: Filed Vitals:   10/30/11 1443 10/30/11 1857 10/30/11 2000  BP: 167/70 152/61 137/78  Pulse: 71 56 61  Temp:  99.2 F (37.3 C) 98 F (36.7 C)  TempSrc:  Oral Oral  Resp: 18 18 18   Height:   5\' 4"  (1.626 m)  Weight:   51.4 kg (113 lb 5.1 oz)  SpO2: 100% 100% 100%  General appearance: alert, cooperative and no distress Lungs: clear to auscultation bilaterally good air sounds to periphery bilaterally Heart: regular rate and rhythm, S1, S2 normal, no murmur, click, rub or gallop Abdomen: soft, non-tender; bowel sounds normal; no masses,  no organomegaly mild left flank pain no r/g nonacute abd Extremities: extremities normal, atraumatic, no cyanosis or edema Pulses: 2+ and symmetric Skin: Skin color, texture, turgor normal. No rashes or lesions Neurologic: Grossly normal    Labs on Admission:   [facility] 10/30/11 1542  NA 137  K 3.8  CL 102  CO2 26  GLUCOSE 84  BUN 13  CREATININE 0.74  CALCIUM 11.3*  MG  --  PHOS --    Basename 10/30/11 1542  WBC 6.7  NEUTROABS 3.9  HGB 12.7  HCT 38.6  MCV 87.7  PLT 238   Radiological Exams on Admission: Dg Chest 2 View  10/30/2011  *RADIOLOGY REPORT*  Clinical Data: Left-sided abdominal pain.  Left lower lobe atelectasis or collapse seen on recent CT.  CHEST - 2 VIEW  Comparison: 11/16/2010  Findings: New left lower lobe collapse is seen compared to prior study.   Subtle increased left perihilar soft tissue prominence noted, and centrally obstructing mass or lymphadenopathy cannot be excluded.  Right lung remains clear.  No evidence of pleural effusion.  Heart size is within normal limits.  Previous right mastectomy axillary lymph node dissection again noted.  IMPRESSION: New left lower lobe collapse and subtle left perihilar soft tissue prominence.  Centrally obstructing left hilar mass or lymphadenopathy cannot be excluded, and chest CT with contrast is recommended for further evaluation.   Original Report Authenticated By: Danae Orleans, M.D.    Ct Abdomen Pelvis W Contrast  10/30/2011  *RADIOLOGY REPORT*  Clinical Data: Left-sided abdominal pain.  Breast cancer.  CT ABDOMEN AND PELVIS WITH CONTRAST  Technique:  Multidetector CT imaging of the abdomen and pelvis was performed following the standard protocol during bolus administration of intravenous contrast.  Contrast: OMNIPAQUE IOHEXOL 300 MG/ML  SOLN  Comparison: 04/25/2001  Findings: New atelectasis or collapse of the left lower lobe is seen since prior study.  Mild hepatic steatosis again demonstrated.  No liver masses are identified.  The gallbladder, spleen, pancreas, and adrenal glands are normal in appearance.  Small cyst in the right upper pole remains stable.  No evidence of renal mass or hydronephrosis. Prior hysterectomy noted, and the adnexae are unremarkable in appearance.  The  No soft tissue masses or lymphadenopathy identified within the abdomen or pelvis.  No evidence of inflammatory  process or abnormal fluid collections.  No evidence of bowel wall thickening or dilatation.  No suspicious bone lesions identified.  IMPRESSION:  1.  No evidence of acute findings or metastatic disease within the abdomen or pelvis. 2.  New left lower lobe atelectasis or collapse. Consider chest radiograph or CT for further evaluation.   Original Report Authenticated By: Danae Orleans, M.D. ( 10/30/2011 11:53:35 )     Assessment/Plan Present on Admission:  78 yo female with abd pain and also new lung mass/lll collapse .Abdominal pain, acute .Collapsed lung .COPD (chronic obstructive pulmonary disease) .Cancer  Sounds like her abd pain is unrelated to her new lung finding.  Her clinical history is c/w kidney stone, will ck ua and cont po cipro.  abd exam is benign.  Will ck ct chest in the am as she cannot receive any further contrast today, place on ivf overnight and better assess lung mass/collapsed lung  with ct chest.  She is in no resp distress from this at this time and o2 sats are normal.  High risk for malignancy.   [name] A [phone] 10/30/2011, 8:45 PM

## 2011-10-30 NOTE — ED Provider Notes (Addendum)
History  This chart was scribed for Donnetta Hutching, MD by Erskine Emery. This patient was seen in room APA09/APA09 and the patient's care was started at 15:13.   CSN: [id number]  Arrival date & time 10/30/11  1418   First MD Initiated Contact with Patient 10/30/11 1513      Chief Complaint  Patient presents with  . Abdominal Pain    (Consider location/radiation/quality/duration/timing/severity/associated sxs/prior treatment) The history is provided by the patient. The history is limited by the condition of the patient. No language interpreter was used.   [name] is a 79 y.o. female brought in by ambulance, who presents to the Emergency Department complaining of gradually worsening left flank pain that radiates to the LLQ of the abdomen for the past 5 days with associated chills and heightened blood pressure but no change in appetite or urination (urine sample was normal upon 2 exams today). Pt was easily ambulatory today. Pt reports she went to see Dr. Phillips Odor (her PCP) this morning thinking she had a kidney issue and he sent her to the ED to get a CT scan. Pt's son reports the pt was at his house 2 days ago complaining of the flank pain but her breathing was baseline, however now pt claims she is experiencing some difficulty breathing that is worse than usual. It is suspected that she might have a collapsed lung. Pt reports she usually takes 4 breathing treatments/day but is not on oxygen at home. Pt no longer smokes because she has a h/o COPD and cancer.   Past Medical History  Diagnosis Date  . COPD (chronic obstructive pulmonary disease)   . Cancer     breast    History reviewed. No pertinent past surgical history.  History reviewed. No pertinent family history.  History  Substance Use Topics  . Smoking status: Not on file  . Smokeless tobacco: Not on file  . Alcohol Use:     OB History    Grav Para Term Preterm Abortions TAB SAB Ect Mult Living                   Review of Systems  Constitutional: Positive for chills. Negative for appetite change.  Respiratory: Positive for shortness of breath.   Gastrointestinal: Positive for abdominal pain.  Genitourinary: Negative for dysuria and difficulty urinating.  Musculoskeletal:       Left flank pain  All other systems reviewed and are negative.    Allergies  Aspirin and Codeine  Home Medications   Current Outpatient Rx  Name Route Sig Dispense Refill  . BUDESONIDE-FORMOTEROL FUMARATE 160-4.5 MCG/ACT IN AERO Inhalation Inhale 2 puffs into the lungs 2 (two) times daily.    Marland Kitchen CIPROFLOXACIN HCL 500 MG PO TABS Oral Take 500 mg by mouth 2 (two) times daily. For 7 days    . DESLORATADINE 5 MG PO TABS Oral Take 5 mg by mouth daily.    Marland Kitchen LANSOPRAZOLE 30 MG PO CPDR Oral Take 30 mg by mouth daily.    Marland Kitchen LEVOTHYROXINE SODIUM 75 MCG PO TABS Oral Take 75 mcg by mouth daily.    . MOMETASONE FUROATE 50 MCG/ACT NA SUSP Nasal Place 2 sprays into the nose daily.    Marland Kitchen OMEPRAZOLE 20 MG PO CPDR Oral Take 20 mg by mouth daily.    Marland Kitchen ROSUVASTATIN CALCIUM 40 MG PO TABS Oral Take 40 mg by mouth daily.    Marland Kitchen SIMVASTATIN 40 MG PO TABS Oral Take 40 mg by mouth daily.  BP 167/70  Pulse 71  Resp 18  SpO2 100%  Physical Exam  Nursing note and vitals reviewed. Constitutional: She is oriented to person, place, and time. She appears well-developed and well-nourished.  HENT:  Head: Normocephalic and atraumatic.  Eyes: Conjunctivae and EOM are normal.  Neck: Normal range of motion. Neck supple.  Cardiovascular: Normal rate and regular rhythm.   Pulmonary/Chest: Effort normal and breath sounds normal.  Abdominal: Soft. Bowel sounds are normal. There is no tenderness.       Abdomen is nontender.  Musculoskeletal: Normal range of motion. She exhibits tenderness.       Minimal left flank tenderness.  Neurological: She is alert and oriented to person, place, and time.  Skin: Skin is warm and dry.  Psychiatric: She  has a normal mood and affect.    ED Course  Procedures (including critical care time) DIAGNOSTIC STUDIES: Oxygen Saturation is 100% on [place], normal by my interpretation.    COORDINATION OF CARE: 15:30--I evaluated the patient and we discussed a treatment plan including CT scan, chest x-ray, and possible admission to which the pt agreed.    Labs Reviewed - No data to display Dg Chest 2 View  10/30/2011  *RADIOLOGY REPORT*  Clinical Data: Left-sided abdominal pain.  Left lower lobe atelectasis or collapse seen on recent CT.  CHEST - 2 VIEW  Comparison: 11/16/2010  Findings: New left lower lobe collapse is seen compared to prior study.   Subtle increased left perihilar soft tissue prominence noted, and centrally obstructing mass or lymphadenopathy cannot be excluded.  Right lung remains clear.  No evidence of pleural effusion.  Heart size is within normal limits.  Previous right mastectomy axillary lymph node dissection again noted.  IMPRESSION: New left lower lobe collapse and subtle left perihilar soft tissue prominence.  Centrally obstructing left hilar mass or lymphadenopathy cannot be excluded, and chest CT with contrast is recommended for further evaluation.   Original Report Authenticated By: Danae Orleans, M.D.    Ct Abdomen Pelvis W Contrast  10/30/2011  *RADIOLOGY REPORT*  Clinical Data: Left-sided abdominal pain.  Breast cancer.  CT ABDOMEN AND PELVIS WITH CONTRAST  Technique:  Multidetector CT imaging of the abdomen and pelvis was performed following the standard protocol during bolus administration of intravenous contrast.  Contrast: OMNIPAQUE IOHEXOL 300 MG/ML  SOLN  Comparison: 04/25/2001  Findings: New atelectasis or collapse of the left lower lobe is seen since prior study.  Mild hepatic steatosis again demonstrated.  No liver masses are identified.  The gallbladder, spleen, pancreas, and adrenal glands are normal in appearance.  Small cyst in the right upper pole remains stable.   No evidence of renal mass or hydronephrosis. Prior hysterectomy noted, and the adnexae are unremarkable in appearance.  The  No soft tissue masses or lymphadenopathy identified within the abdomen or pelvis.  No evidence of inflammatory process or abnormal fluid collections.  No evidence of bowel wall thickening or dilatation.  No suspicious bone lesions identified.  IMPRESSION:  1.  No evidence of acute findings or metastatic disease within the abdomen or pelvis. 2.  New left lower lobe atelectasis or collapse. Consider chest radiograph or CT for further evaluation.   Original Report Authenticated By: Danae Orleans, M.D. ( 10/30/2011 11:53:35 )      No diagnosis found.  Date: 10/30/2011  Rate: 57  Rhythm: sinus bradycardia  QRS Axis: normal  Intervals: normal  ST/T Wave abnormalities: normal  Conduction Disutrbances:none  Narrative Interpretation:  Old EKG Reviewed: changes noted    MDM  Patient initially presented with left flank pain radiating to the left lower quadrant. CT scan of abdomen and pelvis ordered by her primary care doctor revealed a left lower lobe lung collapse. Status post history of breast cancer 20 years ago. CT chest has been unable to be obtained today secondary to previous dye infiltration from abdominal CT.  Will admit for pain control and hydration. Discussed with hospitalist.      I personally performed the services described in this documentation, which was scribed in my presence. The recorded information has been reviewed and considered.    Donnetta Hutching, MD 10/30/11 Berna Spare  Donnetta Hutching, MD 11/11/11 [phone]

## 2011-10-31 ENCOUNTER — Observation Stay (HOSPITAL_COMMUNITY): Payer: Medicare Other

## 2011-10-31 DIAGNOSIS — J449 Chronic obstructive pulmonary disease, unspecified: Secondary | ICD-10-CM

## 2011-10-31 DIAGNOSIS — J9819 Other pulmonary collapse: Principal | ICD-10-CM

## 2011-10-31 DIAGNOSIS — R109 Unspecified abdominal pain: Secondary | ICD-10-CM

## 2011-10-31 DIAGNOSIS — R52 Pain, unspecified: Secondary | ICD-10-CM

## 2011-10-31 LAB — URINALYSIS, ROUTINE W REFLEX MICROSCOPIC
Bilirubin Urine: NEGATIVE
Glucose, UA: NEGATIVE mg/dL
Hgb urine dipstick: NEGATIVE
Protein, ur: NEGATIVE mg/dL

## 2011-10-31 LAB — BASIC METABOLIC PANEL
BUN: 12 mg/dL (ref 6–23)
CO2: 26 mEq/L (ref 19–32)
Calcium: 10.5 mg/dL (ref 8.4–10.5)
Creatinine, Ser: 0.89 mg/dL (ref 0.50–1.10)
GFR calc non Af Amer: 61 mL/min — ABNORMAL LOW (ref >90)
Glucose, Bld: 86 mg/dL (ref 70–99)

## 2011-10-31 LAB — CBC
MCH: 29 pg (ref 26.0–34.0)
MCHC: 32.8 g/dL (ref 30.0–36.0)
MCV: 88.4 fL (ref 78.0–100.0)
Platelets: 212 10*3/uL (ref 150–400)
RDW: 13.7 % (ref 11.5–15.5)

## 2011-10-31 MED ORDER — BOOST / RESOURCE BREEZE PO LIQD
1.0000 | Freq: Two times a day (BID) | ORAL | Status: DC
  Administered 2011-10-31 – 2011-11-01 (×3): 1 via ORAL
  Filled 2011-10-31 (×5): qty 1

## 2011-10-31 MED ORDER — IOHEXOL 300 MG/ML  SOLN
80.0000 mL | Freq: Once | INTRAMUSCULAR | Status: AC | PRN
  Administered 2011-10-31: 80 mL via INTRAVENOUS

## 2011-10-31 MED ORDER — ALBUTEROL SULFATE (5 MG/ML) 0.5% IN NEBU: 2.5000 mg | INHALATION_SOLUTION | Freq: Four times a day (QID) | RESPIRATORY_TRACT | Status: DC

## 2011-10-31 MED ORDER — ACETAMINOPHEN 325 MG PO TABS
650.0000 mg | ORAL_TABLET | Freq: Four times a day (QID) | ORAL | Status: DC | PRN
  Administered 2011-10-31 – 2011-11-01 (×3): 650 mg via ORAL
  Filled 2011-10-31 (×3): qty 2

## 2011-10-31 MED ORDER — ACETAMINOPHEN 500 MG PO TABS
750.0000 mg | ORAL_TABLET | Freq: Once | ORAL | Status: AC
  Administered 2011-10-31: 750 mg via ORAL
  Filled 2011-10-31: qty 2

## 2011-10-31 MED ORDER — ALBUTEROL SULFATE (5 MG/ML) 0.5% IN NEBU
2.5000 mg | INHALATION_SOLUTION | Freq: Three times a day (TID) | RESPIRATORY_TRACT | Status: DC
  Administered 2011-10-31 – 2011-11-01 (×4): 2.5 mg via RESPIRATORY_TRACT
  Filled 2011-10-31 (×4): qty 0.5

## 2011-10-31 NOTE — Progress Notes (Signed)
Patient was complaining of back pain. Patient stated that she does not take pain medicine at home and said that Tylenol would be fine. Doctor was notified and new orders were given for a one time dose of Tylenol.

## 2011-10-31 NOTE — Progress Notes (Signed)
UR Chart Review Completed  

## 2011-10-31 NOTE — Care Management Note (Unsigned)
    Page 1 of 1   10/31/2011     1:23:53 PM   CARE MANAGEMENT NOTE 10/31/2011  Patient:  [name], [name]   Account Number:  [id number]  Date Initiated:  10/31/2011  Documentation initiated by:  Rosemary Holms  Subjective/Objective Assessment:   Pt lives alone but son lives in the house in front of her. Both her son and son in law in the room with pt. Family very supportive.     Action/Plan:   At this time, all are in agreement that no HH needs are identified. CM will follow.   Anticipated DC Date:  10/31/2011   Anticipated DC Plan:  HOME/SELF CARE      DC Planning Services  CM consult      Choice offered to / List presented to:             Status of service:  In process, will continue to follow Medicare Important Message given?   (If response is "NO", the following Medicare IM given date fields will be blank) Date Medicare IM given:   Date Additional Medicare IM given:    Discharge Disposition:    Per UR Regulation:    If discussed at Long Length of Stay Meetings, dates discussed:    Comments:  10/31/11 1130 Shyanna Klingel Leanord Hawking RN BSN CM

## 2011-10-31 NOTE — Progress Notes (Signed)
[facility] PROGRESS NOTE  [name] UJW:[id number] DOB: 06-03-1933 DOA: 10/30/2011 PCP: Colette Ribas, MD  Assessment/Plan: Principal Problem:  Abdominal pain, acute Patient's pain is suspicious for kidney stones but her CT of the abdomen and pelvis was negative for stones. She was empirically started on Cipro. UA is negative. Will continue to monitor. Her symptoms have improved.  Active Problems:  COPD (chronic obstructive pulmonary disease) Continue with her home pulmonary medications.  Patient is stable and oxygenation is 99%.   Collapsed lung/ history of Breast Cancer Patient had what seemed like a collapsed lung versus atelectasis on CT of the abdomen. Yesterday she could not have CT of the chest secondary to just having  CT of the abdomen. Patient is scheduled for CT of the chest today. Further management depends on results of the CT chest.   Code Status: Full code  Family Communication: Discussed with patient  Disposition Plan: Home in 1-2 days Molli Posey, MD  [facility] Pager [phone]  If 8PM-8AM, please contact night-coverage [email or web address] Password [email or web address] 10/31/2011, 10:51 AM   LOS: 1 day   Brief narrative: Patient is a 78 year old white female with history of breast cancer presented to her primary care with abdominal pain  and was being worked up for kidney stones.  She was noted to have a collapsed lung on CT of the abdomen. Patient was admitted for CT of the chest and further management of ? her collapsed lung.  Consultants: None  Procedures:  None  Antibiotics:  Cipro 8/20 ?  HPI/Subjective: Patient feels fine her abdominal pain has improved no chest pain or shortness of breath.  Objective: Filed Vitals:   10/30/11 2000 10/30/11 2324 10/31/11 0601 10/31/11 0843  BP: 137/78  110/62   Pulse: 61  55 56  Temp: 98 F (36.7 C)  97.3 F (36.3 C)   TempSrc: Oral  Oral   Resp: 18  18 17   Height: 5\' 4"  (1.626 m)     Weight: 51.4 kg  (113 lb 5.1 oz)     SpO2: 100% 100% 99% 99%    Intake/Output Summary (Last 24 hours) at 10/31/11 1051 Last data filed at 10/31/11 0843  Gross per 24 hour  Intake 1193.75 ml  Output    950 ml  Net 243.75 ml    Exam:   General:  Patient lying in bed does not seem to be in any acute distress.   Cardiovascular: Regular rate rhythm  Respiratory: Clear to auscultations bilaterally  ABD: Positive bowel sounds  Ext: No edema  Data Reviewed: Basic Metabolic Panel:  Lab 10/31/11 6578 10/30/11 1542  NA 140 137  K 4.0 3.8  CL 107 102  CO2 26 26  GLUCOSE 86 84  BUN 12 13  CREATININE 0.89 0.74  CALCIUM 10.5 11.3*  MG -- --  PHOS -- --   Liver Function Tests:  Lab 10/30/11 2048  AST 19  ALT 10  ALKPHOS 74  BILITOT 0.3  PROT 6.5  ALBUMIN 3.5    Lab 10/30/11 2048  LIPASE 53  AMYLASE --     Lab 10/31/11 0514 10/30/11 1542  WBC 6.0 6.7  NEUTROABS -- 3.9  HGB 12.0 12.7  HCT 36.6 38.6  MCV 88.4 87.7  PLT 212 238     Studies: Dg Chest 2 View  10/30/2011  *RADIOLOGY REPORT*  Clinical Data: Left-sided abdominal pain.  Left lower lobe atelectasis or collapse seen on recent CT.  CHEST - 2 VIEW  Comparison: 11/16/2010  Findings:  New left lower lobe collapse is seen compared to prior study.   Subtle increased left perihilar soft tissue prominence noted, and centrally obstructing mass or lymphadenopathy cannot be excluded.  Right lung remains clear.  No evidence of pleural effusion.  Heart size is within normal limits.  Previous right mastectomy axillary lymph node dissection again noted.  IMPRESSION: New left lower lobe collapse and subtle left perihilar soft tissue prominence.  Centrally obstructing left hilar mass or lymphadenopathy cannot be excluded, and chest CT with contrast is recommended for further evaluation.   Original Report Authenticated By: Danae Orleans, M.D.    Ct Abdomen Pelvis W Contrast  10/30/2011  *RADIOLOGY REPORT*  Clinical Data: Left-sided abdominal  pain.  Breast cancer.  CT ABDOMEN AND PELVIS WITH CONTRAST  Technique:  Multidetector CT imaging of the abdomen and pelvis was performed following the standard protocol during bolus administration of intravenous contrast.  Contrast: OMNIPAQUE IOHEXOL 300 MG/ML  SOLN  Comparison: 04/25/2001  Findings: New atelectasis or collapse of the left lower lobe is seen since prior study.  Mild hepatic steatosis again demonstrated.  No liver masses are identified.  The gallbladder, spleen, pancreas, and adrenal glands are normal in appearance.  Small cyst in the right upper pole remains stable.  No evidence of renal mass or hydronephrosis. Prior hysterectomy noted, and the adnexae are unremarkable in appearance.  The  No soft tissue masses or lymphadenopathy identified within the abdomen or pelvis.  No evidence of inflammatory process or abnormal fluid collections.  No evidence of bowel wall thickening or dilatation.  No suspicious bone lesions identified.  IMPRESSION:  1.  No evidence of acute findings or metastatic disease within the abdomen or pelvis. 2.  New left lower lobe atelectasis or collapse. Consider chest radiograph or CT for further evaluation.   Original Report Authenticated By: Danae Orleans, M.D. ( 10/30/2011 11:53:35 )     Scheduled Meds:   . acetaminophen  750 mg Oral Once  . albuterol  2.5 mg Nebulization TID  . antiseptic oral rinse  15 mL Mouth Rinse BID  . budesonide-formoterol  2 puff Inhalation BID  . ciprofloxacin  500 mg Oral BID  . clopidogrel  75 mg Oral QPM  . levothyroxine  75 mcg Oral BH-q7a  . morphine  2 mg Intravenous Once  . ondansetron  4 mg Intravenous Once  . pneumococcal 23 valent vaccine  0.5 mL Intramuscular Tomorrow-1000  . tiotropium  18 mcg Inhalation BH-q7a  . DISCONTD: albuterol  2.5 mg Nebulization Q6H  . DISCONTD: ketorolac  30 mg Intravenous Once   Continuous Infusions:   . sodium chloride 75 mL/hr at 10/31/11 0615  . DISCONTD: sodium chloride 125  mL/hr at 10/30/11 1851     Time Spent:25 min

## 2011-10-31 NOTE — Progress Notes (Signed)
INITIAL ADULT NUTRITION ASSESSMENT Date: 10/31/2011   Time: 2:43 PM Reason for Assessment: Nutrition Risk-chewing swallow difficulty and wt loss  ASSESSMENT: Female 78 y.o.  Dx: Abdominal pain, acute   Past Medical History  Diagnosis Date  . COPD (chronic obstructive pulmonary disease)   . Cancer     breast     Scheduled Meds:   . acetaminophen  750 mg Oral Once  . albuterol  2.5 mg Nebulization TID  . antiseptic oral rinse  15 mL Mouth Rinse BID  . budesonide-formoterol  2 puff Inhalation BID  . ciprofloxacin  500 mg Oral BID  . clopidogrel  75 mg Oral QPM  . levothyroxine  75 mcg Oral BH-q7a  . morphine  2 mg Intravenous Once  . ondansetron  4 mg Intravenous Once  . pneumococcal 23 valent vaccine  0.5 mL Intramuscular Tomorrow-1000  . tiotropium  18 mcg Inhalation BH-q7a  . DISCONTD: albuterol  2.5 mg Nebulization Q6H  . DISCONTD: ketorolac  30 mg Intravenous Once   Continuous Infusions:   . sodium chloride 75 mL/hr at 10/31/11 0615  . DISCONTD: sodium chloride 125 mL/hr at 10/30/11 1851   PRN Meds:.acetaminophen, albuterol, iohexol, ondansetron (ZOFRAN) IV, ondansetron  Ht: 5\' 4"  (162.6 cm)  Wt: 113 lb 5.1 oz (51.4 kg)  Ideal Wt: 54.7 kg % Ideal Wt: 94%  Usual Wt: 120# (~ 1 year ago), and 114# over past 2 months (weighed at recent Dr. Algie Coffer) % Usual Wt:94%   Body mass index is 19.45 kg/(m^2).Normal range  Food/Nutrition Related Hx: Pt and daughter present. Her diet has been advanced to Regular and she is tolerating per pt po's today are >75%.She reports wt loss since being diagnosed with COPD ~ 1 year ago. Hx of breast cancer s/p mastectomy. Acute abdominal pain improving.  CMP     Component Value Date/Time   NA 140 10/31/2011 0514   K 4.0 10/31/2011 0514   CL 107 10/31/2011 0514   CO2 26 10/31/2011 0514   GLUCOSE 86 10/31/2011 0514   BUN 12 10/31/2011 0514   CREATININE 0.89 10/31/2011 0514   CALCIUM 10.5 10/31/2011 0514   PROT 6.5 10/30/2011 2048   ALBUMIN 3.5 10/30/2011 2048   AST 19 10/30/2011 2048   ALT 10 10/30/2011 2048   ALKPHOS 74 10/30/2011 2048   BILITOT 0.3 10/30/2011 2048   GFRNONAA 61* 10/31/2011 0514   GFRAA 71* 10/31/2011 0514    Intake/Output Summary (Last 24 hours) at 10/31/11 1454 Last data filed at 10/31/11 0843  Gross per 24 hour  Intake 1193.75 ml  Output    950 ml  Net 243.75 ml     Diet Order: General  Supplements/Tube Feeding:none at this time  IVF:    sodium chloride Last Rate: 75 mL/hr at 10/31/11 1610  DISCONTD: sodium chloride Last Rate: 125 mL/hr at 10/30/11 1851    Estimated Nutritional Needs:   Kcal:1530-1683 kcal/day Protein:60-70 gr/day Fluid:1 ml/kcal  NUTRITION DIAGNOSIS: -Predicted suboptimal energy intake (NI-1.6).  Status: Ongoing  RELATED TO: Chronic dz  AS EVIDENCE BY: COPD, hx of breast cancer   MONITORING/EVALUATION(Goals): -Monitor diet tolerance and meal and supplement intatke Pt will consistantly consume >75% of meals and suppl daily   EDUCATION NEEDS: -Education needs addressed  INTERVENTION: -Resource Breeze po BID, each supplement provides 250 kcal and 9 grams of protein. -RD to follow for nutrition needs.  Dietitian [phone]  DOCUMENTATION CODES Per approved criteria  -Not Applicable    [name] 10/31/2011, 2:43 PM

## 2011-11-01 ENCOUNTER — Observation Stay (HOSPITAL_COMMUNITY): Payer: Medicare Other

## 2011-11-01 DIAGNOSIS — R222 Localized swelling, mass and lump, trunk: Secondary | ICD-10-CM

## 2011-11-01 LAB — CBC
Hemoglobin: 12.2 g/dL (ref 12.0–15.0)
MCH: 29 pg (ref 26.0–34.0)
MCV: 88.3 fL (ref 78.0–100.0)
Platelets: 218 10*3/uL (ref 150–400)
RBC: 4.2 MIL/uL (ref 3.87–5.11)

## 2011-11-01 LAB — BASIC METABOLIC PANEL
CO2: 25 mEq/L (ref 19–32)
Calcium: 10.8 mg/dL — ABNORMAL HIGH (ref 8.4–10.5)
Creatinine, Ser: 0.93 mg/dL (ref 0.50–1.10)
Glucose, Bld: 116 mg/dL — ABNORMAL HIGH (ref 70–99)

## 2011-11-01 MED ORDER — POLYETHYLENE GLYCOL 3350 17 G PO PACK
17.0000 g | PACK | Freq: Every day | ORAL | Status: DC
  Administered 2011-11-01: 17 g via ORAL
  Filled 2011-11-01: qty 1

## 2011-11-01 MED ORDER — OXYCODONE-ACETAMINOPHEN 5-325 MG PO TABS: 1.0000 | ORAL_TABLET | ORAL | Status: DC | PRN

## 2011-11-01 MED ORDER — BENZONATATE 100 MG PO CAPS
100.0000 mg | ORAL_CAPSULE | Freq: Three times a day (TID) | ORAL | Status: DC
  Administered 2011-11-01: 100 mg via ORAL
  Filled 2011-11-01: qty 1

## 2011-11-01 MED ORDER — OXYCODONE-ACETAMINOPHEN 5-325 MG PO TABS
1.0000 | ORAL_TABLET | ORAL | Status: DC | PRN
  Administered 2011-11-01: 1 via ORAL
  Filled 2011-11-01: qty 1

## 2011-11-01 MED ORDER — BENZONATATE 100 MG PO CAPS: 100.0000 mg | ORAL_CAPSULE | Freq: Three times a day (TID) | ORAL | Status: AC | PRN

## 2011-11-01 NOTE — Discharge Summary (Signed)
Physician Discharge Summary  [name] UJW:[id number] DOB: 1933/09/24 DOA: 10/30/2011  PCP: Colette Ribas, MD  Admit date: 10/30/2011 Discharge date: 11/01/2011  Recommendations for Outpatient Follow-up:  1. Followup with Dr. Juanetta Gosling on Monday to discuss bronchoscopy, further workup per Dr. Juanetta Gosling 2. Followup with primary care doctor in 2 weeks  Discharge Diagnoses:  Principal Problem:  *Abdominal pain, acute Active Problems:  COPD (chronic obstructive pulmonary disease)  Collapsed lung  Cancer  Lung mass   Discharge Condition: Stable  Diet recommendation: Heart healthy  Filed Weights   10/30/11 2000  Weight: 51.4 kg (113 lb 5.1 oz)    History of present illness:  78 yo female with with 4 day h/o left flank pain that radiates to the llq. Has seen pcp and says they cked her urine which was normal but she was placed on cipro. Sent to hosp by pcp for ct abd/pelvis which incidentally showed a collapsed ll lung with ??central mass. Xsmoker. H/o breast cancer. Denies any fevers/cough. Some nausea associated with her abd pain but no vomiting or diarrhea. Was dx with copd about a year ago and says she has had wt loss over the last year since that dx. No dysuria or hematuria. No cp, some mild sob.   Hospital Course:  This lady was admitted to the hospital with complaints of left flank pain. She denies any nausea or vomiting. She has COPD and is followed by Dr. Juanetta Gosling. Workup in the hospital included a CT of the abdomen and pelvis which was essentially unremarkable. Urinalysis did not indicate any blood or signs of infection. There was no mention of any renal stones on CT. Patient was given pain medication which improved her discomfort. Incidentally her CT abdomen and pelvis showed left lung collapse. This was further evaluated with a chest CT, which revealed a left hilar mass. This was discussed with Dr. Juanetta Gosling who knows the patient very well. Outpatient followup with Dr. Juanetta Gosling  was recommended for anticipated bronchoscopy. Patient will likely need a PET scan and referral to oncology clinic. We will defer this to Dr. Juanetta Gosling once bronchoscopy results are available. It is questionable if her abdominal pain is referred from her left lung collapse. The remainder of her hospital course has remained unremarkable.  Procedures:  None  Consultations:  Telephone consultation with Dr. Juanetta Gosling  Discharge Exam: Filed Vitals:   11/01/11 0458  BP: 106/63  Pulse: 57  Temp: 97.5 F (36.4 C)  Resp: 20   Filed Vitals:   10/31/11 2045 11/01/11 0458 11/01/11 0707 11/01/11 1436  BP: 94/53 106/63    Pulse: 67 57    Temp: 98.6 F (37 C) 97.5 F (36.4 C)    TempSrc: Oral Oral    Resp: 20 20    Height:      Weight:      SpO2: 99% 98% 98% 97%    General: No acute distress Cardiovascular: S1, S2, regular rate and rhythm Respiratory: Clear to auscultation bilaterally  Discharge Instructions  Discharge Orders    Future Orders Please Complete By Expires   Diet - low sodium heart healthy      Increase activity slowly      Call MD for:  temperature >100.4      Call MD for:  persistant nausea and vomiting      Call MD for:  severe uncontrolled pain        Medication List  As of 11/01/2011  3:53 PM   TAKE these medications  albuterol (2.5 MG/3ML) 0.083% nebulizer solution   Commonly known as: PROVENTIL   Take 2.5 mg by nebulization every 4 (four) hours as needed.      benzonatate 100 MG capsule   Commonly known as: TESSALON   Take 1 capsule (100 mg total) by mouth 3 (three) times daily as needed for cough.      budesonide-formoterol 160-4.5 MCG/ACT inhaler   Commonly known as: SYMBICORT   Inhale 2 puffs into the lungs 2 (two) times daily.      ciprofloxacin 500 MG tablet   Commonly known as: CIPRO   Take 500 mg by mouth 2 (two) times daily. For 7 days      clopidogrel 75 MG tablet   Commonly known as: PLAVIX   Take 75 mg by mouth every evening.       fish oil-omega-3 fatty acids 1000 MG capsule   Take 1 g by mouth every morning.      levothyroxine 75 MCG tablet   Commonly known as: SYNTHROID, LEVOTHROID   Take 75 mcg by mouth every morning.      mometasone 50 MCG/ACT nasal spray   Commonly known as: NASONEX   Place 2 sprays into the nose daily.      oxyCODONE-acetaminophen 5-325 MG per tablet   Commonly known as: PERCOCET/ROXICET   Take 1 tablet by mouth every 4 (four) hours as needed.      polyethylene glycol powder powder   Commonly known as: GLYCOLAX/MIRALAX   Take 17 g by mouth daily.      rosuvastatin 20 MG tablet   Commonly known as: CRESTOR   Take 20 mg by mouth every evening.      STOOL SOFTENER PO   Take 1 capsule by mouth every morning.      tiotropium 18 MCG inhalation capsule   Commonly known as: SPIRIVA   Place 18 mcg into inhaler and inhale every morning.      VITAMIN D PO   Take 1 tablet by mouth every morning.           Follow-up Information    Follow up with HAWKINS,EDWARD L, MD. (monday at 11:00am)    Contact information:   [address] [place] [address] [phone]       Follow up with Colette Ribas, MD. Schedule an appointment as soon as possible for a visit in 2 weeks.   Contact information:   [address] [place] [address] [place] [address] [phone]           The results of significant diagnostics from this hospitalization (including imaging, microbiology, ancillary and laboratory) are listed below for reference.    Significant Diagnostic Studies: Dg Chest 2 View  10/30/2011  *RADIOLOGY REPORT*  Clinical Data: Left-sided abdominal pain.  Left lower lobe atelectasis or collapse seen on recent CT.  CHEST - 2 VIEW  Comparison: 11/16/2010  Findings: New left lower lobe collapse is seen compared to prior study.   Subtle increased left perihilar soft tissue prominence noted, and centrally obstructing mass or  lymphadenopathy cannot be excluded.  Right lung remains clear.  No evidence of pleural effusion.  Heart size is within normal limits.  Previous right mastectomy axillary lymph node dissection again noted.  IMPRESSION: New left lower lobe collapse and subtle left perihilar soft tissue prominence.  Centrally obstructing left hilar mass or lymphadenopathy cannot be excluded, and chest CT with contrast is recommended for further evaluation.  Original Report Authenticated By: Danae Orleans, M.D.    Dg Lumbar Spine 2-3 Views  11/01/2011  *RADIOLOGY REPORT*  Clinical Data: Hip and flank pain.  LUMBAR SPINE - 2-3 VIEW  Comparison: CT 10/30/2011  Findings: Bone detail is obscured by the colonic oral contrast. The alignment of the lumbar spine is stable with minimal curvature. There is marked disc space loss at L4-L5 and L5-S1.  Vertebral body heights are maintained.  Degenerative disc changes at L1-L2.  There is a right iliac arterial stent.  IMPRESSION: Degenerative disc disease in the lumbar spine.  No acute bony abnormality.   Original Report Authenticated By: Richarda Overlie, M.D.    Dg Pelvis 1-2 Views  11/01/2011  *RADIOLOGY REPORT*  Clinical Data: Right hip and flank pain.  PELVIS - 1-2 VIEW  Comparison: 07/12/2009 and CT from 10/30/2011  Findings: Single view of the pelvis was obtained.  There is contrast within the transverse colon and stool throughout the colon.  Pelvic bony ring is intact. Vascular calcifications in both groins.  Negative for acute fracture or dislocation.  Degenerative changes in the lower lumbar spine.  IMPRESSION: No acute bony abnormality in the pelvis.   Original Report Authenticated By: Richarda Overlie, M.D.    Ct Chest W Contrast  10/31/2011  *RADIOLOGY REPORT*  Clinical Data: Left lower lobe collapse on recent radiographs and abdominal CT.  History of breast cancer.  CT CHEST WITH CONTRAST  Technique:  Multidetector CT imaging of the chest was performed following the standard protocol during  bolus administration of intravenous contrast.  Contrast: 80mL OMNIPAQUE IOHEXOL 300 MG/ML  SOLN  Comparison: Chest radiographs 10/30/2011, abdominal CT 10/30/2011 and chest CT 04/25/2001.  Findings: As demonstrated on recent studies, there is complete left lower lobe collapse.  There is an irregular left hilar mass which measures up to 3.4 x 2.3 cm transverse.  Is completely occludes the left lower lobe bronchus and narrows the left lower lobe pulmonary artery.  There are small left infrahilar calcifications within the collapsed left lower lobe.  Confluent left hilar adenopathy extends into the AP window were measures 1.8 x 2.4 cm transverse.  There is also a prevascular adenopathy measuring 2.4 x 1.2 cm on image 23.  There is a new nonspecific focal ground-glass opacity in the left upper lobe, measuring 12.0 mm on image 20. There is also a new irregular 5 mm nodular density in the right upper lobe on image 11. Right apical scarring appears stable.  There are stable postsurgical changes within the right axilla status post axillary node dissection.  There is no axillary or internal mammary lymphadenopathy.  There is no pleural or pericardial effusion.  Great vessel and coronary artery atherosclerosis is noted.  There are no suspicious findings within the upper abdomen.  No definite osseous metastases are identified.  Probable hemangiomas within the spine are unchanged.  IMPRESSION:  1.  Chest CT confirms the presence of complete left lower lobe collapse secondary to a central left hilar mass with adjacent contiguous adenopathy extending into the AP window and prevascular space.  Findings are highly concerning for bronchogenic carcinoma; metastatic breast cancer is considered less likely. 2.  Nonspecific irregular right apical density could reflect postinflammatory scarring or a small mass. 3.  Nonspecific new ground-glass opacity in the left upper lobe could reflect a focus of inflammation or adenocarcinoma.   Bronchoscopy is recommended for tissue sampling.  PET CT may be helpful for further staging. If multidisciplinary follow up management is desired, this  is available in the [facility] through the [facility] [phone].   Original Report Authenticated By: Gerrianne Scale, M.D.    Ct Abdomen Pelvis W Contrast  10/30/2011  *RADIOLOGY REPORT*  Clinical Data: Left-sided abdominal pain.  Breast cancer.  CT ABDOMEN AND PELVIS WITH CONTRAST  Technique:  Multidetector CT imaging of the abdomen and pelvis was performed following the standard protocol during bolus administration of intravenous contrast.  Contrast: OMNIPAQUE IOHEXOL 300 MG/ML  SOLN  Comparison: 04/25/2001  Findings: New atelectasis or collapse of the left lower lobe is seen since prior study.  Mild hepatic steatosis again demonstrated.  No liver masses are identified.  The gallbladder, spleen, pancreas, and adrenal glands are normal in appearance.  Small cyst in the right upper pole remains stable.  No evidence of renal mass or hydronephrosis. Prior hysterectomy noted, and the adnexae are unremarkable in appearance.  The  No soft tissue masses or lymphadenopathy identified within the abdomen or pelvis.  No evidence of inflammatory process or abnormal fluid collections.  No evidence of bowel wall thickening or dilatation.  No suspicious bone lesions identified.  IMPRESSION:  1.  No evidence of acute findings or metastatic disease within the abdomen or pelvis. 2.  New left lower lobe atelectasis or collapse. Consider chest radiograph or CT for further evaluation.   Original Report Authenticated By: Danae Orleans, M.D. ( 10/30/2011 11:53:35 )     Microbiology: No results found for this or any previous visit (from the past 240 hour(s)).   Labs: Basic Metabolic Panel:  Lab 11/01/11 0981 10/31/11 0514 10/30/11 1542  NA 138 140 137  K 3.8 4.0 3.8  CL 106 107 102  CO2 25 26 26   GLUCOSE 116* 86 84  BUN 13 12 13     CREATININE 0.93 0.89 0.74  CALCIUM 10.8* 10.5 11.3*  MG -- -- --  PHOS -- -- --   Liver Function Tests:  Lab 10/30/11 2048  AST 19  ALT 10  ALKPHOS 74  BILITOT 0.3  PROT 6.5  ALBUMIN 3.5    Lab 10/30/11 2048  LIPASE 53  AMYLASE --   No results found for this basename: AMMONIA:5 in the last 168 hours CBC:  Lab 11/01/11 0503 10/31/11 0514 10/30/11 1542  WBC 7.8 6.0 6.7  NEUTROABS -- -- 3.9  HGB 12.2 12.0 12.7  HCT 37.1 36.6 38.6  MCV 88.3 88.4 87.7  PLT 218 212 238   Cardiac Enzymes: No results found for this basename: CKTOTAL:5,CKMB:5,CKMBINDEX:5,TROPONINI:5 in the last 168 hours BNP: BNP (last 3 results) No results found for this basename: PROBNP:3 in the last 8760 hours CBG: No results found for this basename: GLUCAP:5 in the last 168 hours  Time coordinating discharge: Greater than 30 minutes  Signed:  Illiana Losurdo  [facility] 11/01/2011, 3:53 PM

## 2011-11-01 NOTE — Progress Notes (Signed)
Pt with orders to be discharge home. Discharge instructions given to pt and son, verbalized understanding. Prescriptions given. Pt is going to follow up with Dr. Juanetta Gosling. Pt in stable condition upon discharge. Pt left with son via private vehicle.

## 2011-11-06 ENCOUNTER — Encounter (HOSPITAL_COMMUNITY): Payer: Self-pay | Admitting: Pharmacy Technician

## 2011-11-06 NOTE — H&P (Signed)
[name], [name] NO.:  [id number]  MEDICAL RECORD NO.:  [id number]  LOCATION:  [facility]                         FACILITY:  [facility]  PHYSICIAN:  Sundi Slevin L. Juanetta Gosling, M.D.DATE OF BIRTH:  [birth year]  DATE OF ADMISSION:  11/09/2011 DATE OF DISCHARGE:  LH                             HISTORY & PHYSICAL   The patient being set up for bronchoscopy on November 09, 2011.  REASON FOR BRONCHOSCOPY:  Abnormal chest x-ray.  HISTORY:  [name] is a 78 year old, who was admitted to the hospital earlier this month with shortness of breath.  She was found to have abnormalities on her chest x-ray, and then had a CT.  CT showed a left __________ and left lung collapse.  Her past medical history is positive for COPD.  Her family history is not known to be positive for lung cancer.  SOCIAL HISTORY:  She is an ex-cigarette smoker, but stopped many years ago.  REVIEW OF SYSTEMS:  She has not had any weight loss, nausea, vomiting, diarrhea, or chest pain.  She has not had any hemoptysis.  PHYSICAL EXAMINATION:  GENERAL:  Shows a well-developed, well-nourished female who is in no acute distress.  She is somewhat hoarse which is chronic. EARS:  Her tympanic membranes are intact. NECK:  Supple. CHEST:  Relatively clear. HEART:  Regular without gallop. ABDOMEN:  Soft. EXTREMITIES:  Showed no edema.  Assessment then is that she has abnormalities on chest x-ray that indicate that she may have a lung cancer.  My plan then is for her to have fiberoptic bronchoscopy with biopsies.     Raetta Agostinelli L. Juanetta Gosling, M.D.     ELH/MEDQ  D:  11/06/2011  T:  11/06/2011  Job:  [id number]  cc:   Hospitalist, [facility] Dr. Phillips Odor I have seen and examined the  Patient and there are no changes

## 2011-11-09 ENCOUNTER — Encounter (HOSPITAL_COMMUNITY)
Admission: RE | Disposition: A | Discharge status: Home / Self Care | Payer: Self-pay | Source: Ambulatory Visit | Attending: Pulmonary Disease

## 2011-11-09 ENCOUNTER — Ambulatory Visit (HOSPITAL_COMMUNITY)
Admission: RE | Admit: 2011-11-09 | Discharge: 2011-11-09 | Disposition: A | Discharge status: Home / Self Care | Payer: Medicare Other | Source: Ambulatory Visit | Attending: Pulmonary Disease | Admitting: Pulmonary Disease

## 2011-11-09 ENCOUNTER — Ambulatory Visit (HOSPITAL_COMMUNITY): Payer: Medicare Other

## 2011-11-09 DIAGNOSIS — C3430 Malignant neoplasm of lower lobe, unspecified bronchus or lung: Secondary | ICD-10-CM | POA: Insufficient documentation

## 2011-11-09 HISTORY — PX: FLEXIBLE BRONCHOSCOPY: SHX5094

## 2011-11-09 SURGERY — BRONCHOSCOPY, FLEXIBLE
Anesthesia: Moderate Sedation | Site: Bronchus | Laterality: Left

## 2011-11-09 MED ORDER — LIDOCAINE HCL (PF) 2 % IJ SOLN
INTRAMUSCULAR | Status: AC
  Filled 2011-11-09: qty 2

## 2011-11-09 MED ORDER — MIDAZOLAM HCL 10 MG/2ML IJ SOLN
INTRAMUSCULAR | Status: DC | PRN
  Administered 2011-11-09 (×2): 2.5 mg via INTRAVENOUS

## 2011-11-09 MED ORDER — SODIUM CHLORIDE 0.9 % IR SOLN
Status: DC | PRN
  Administered 2011-11-09: 500 mL

## 2011-11-09 MED ORDER — LIDOCAINE VISCOUS 2 % MT SOLN
OROMUCOSAL | Status: AC
  Filled 2011-11-09: qty 15

## 2011-11-09 MED ORDER — LIDOCAINE HCL 2 % IJ SOLN
INTRAMUSCULAR | Status: DC | PRN
  Administered 2011-11-09: 6 mL
  Administered 2011-11-09: 14 mL

## 2011-11-09 MED ORDER — LIDOCAINE VISCOUS 2 % MT SOLN
OROMUCOSAL | Status: DC | PRN
  Administered 2011-11-09: 3 mL via OROMUCOSAL
  Administered 2011-11-09: 4 mL via OROMUCOSAL

## 2011-11-09 MED ORDER — LIDOCAINE HCL (PF) 2 % IJ SOLN
INTRAMUSCULAR | Status: AC
  Filled 2011-11-09: qty 20

## 2011-11-09 MED ORDER — BUTAMBEN-TETRACAINE-BENZOCAINE 2-2-14 % EX AERO
INHALATION_SPRAY | CUTANEOUS | Status: DC | PRN
  Administered 2011-11-09: 2 via TOPICAL

## 2011-11-09 MED ORDER — FLUMAZENIL 0.5 MG/5ML IV SOLN
INTRAVENOUS | Status: AC
  Filled 2011-11-09: qty 5

## 2011-11-09 MED ORDER — MIDAZOLAM HCL 10 MG/2ML IJ SOLN
INTRAMUSCULAR | Status: AC
  Filled 2011-11-09: qty 4

## 2011-11-09 SURGICAL SUPPLY — 16 items
BRUSH CYTOL CELLEBRITY 1.5X140 (MISCELLANEOUS) ×2 IMPLANT
CLOTH BEACON ORANGE TIMEOUT ST (SAFETY) ×2 IMPLANT
CONNECTOR 5 IN 1 STRAIGHT STRL (MISCELLANEOUS) ×2 IMPLANT
FORCEPS BIOP RJ4 1.8 (CUTTING FORCEPS) ×2 IMPLANT
GLOVE BIO SURGEON STRL SZ7.5 (GLOVE) ×2 IMPLANT
KIT CLEAN CATCH URINE (SET/KITS/TRAYS/PACK) ×2 IMPLANT
MARKER SKIN DUAL TIP RULER LAB (MISCELLANEOUS) ×2 IMPLANT
NS IRRIG 1000ML POUR BTL (IV SOLUTION) ×2 IMPLANT
SPONGE GAUZE 4X4 12PLY (GAUZE/BANDAGES/DRESSINGS) ×2 IMPLANT
SYR 20CC LL (SYRINGE) ×2 IMPLANT
SYR 30ML LL (SYRINGE) ×2 IMPLANT
SYR CONTROL 10ML LL (SYRINGE) ×2 IMPLANT
TRAP SPECIMEN CP (MISCELLANEOUS) ×2 IMPLANT
VALVE DISPOSABLE (MISCELLANEOUS) ×2 IMPLANT
WATER STERILE IRR 1000ML POUR (IV SOLUTION) ×2 IMPLANT
YANKAUER SUCT BULB TIP 10FT TU (MISCELLANEOUS) ×4 IMPLANT

## 2011-11-09 NOTE — Progress Notes (Signed)
Arouses to name. Moaning/groaning. Oriented to place per nurse. Frequent non-productive cough present. Returns to sleep.

## 2011-11-09 NOTE — Progress Notes (Signed)
Sleeping quietly.

## 2011-11-09 NOTE — Op Note (Signed)
Bronchoscopy Procedure Note  Date of Operation: 11/09/2011  Pre-op Diagnosis: abnormal CXR  Post-op Diagnosis: Same with left lung mass  Surgeon: Fredirick Maudlin  Assistants:   Anesthesia: Monitored Local Anesthesia with Sedation  Operation: Flexible fiberoptic bronchoscopy, diagnostic with biopsies brushings and washings  Findings: Endobronchial lesion left bronchus intermedius  Specimen: Biopsies washings and brushing  Estimated Blood Loss: Minimal  Drains: None  Complications: None  Indications and History: The patient is a 78 y.o. female with COPD and abnormal x-ray suggesting a left lung mass..  The risks, benefits, complications, treatment options and expected outcomes were discussed with the patient.  The possibilities of reaction to medication, pulmonary aspiration, perforation of a viscus, bleeding, failure to diagnose a condition and creating a complication requiring transfusion or operation were discussed with the patient who freely signed the consent.    Description of Procedure: The patient was seen in the Holding Room and the site of surgery properly noted/marked.  The patient was taken to operating room 2, identified as [name] and the procedure verified as Flexible Fiberoptic Bronchoscopy.  A Time Out was held and the above information confirmed.   After the induction of topical nasopharyngeal anesthesia, the patient was positioned supine and the bronchoscope was passed through the right naris . The vocal cords were visualized and  2% plain lidocaine 5 ml was topically placed onto the cords. The cords were passed and were normal in appearance. The scope was then passed into the trachea.  2% plain lidocaine 10 ml was used topically on the carina.  Careful inspection of the tracheal lumen was accomplished. The scope was sequentially passed into the left main and then left upper and lower bronchi and segmental bronchi. Biopsies brushings and washings was done and  there was a 3 specimen.   The scope was then withdrawn and advanced into the right main bronchus and then into the RUL, RML, and RLL bronchi and segmental bronchi. Inspection was done and there was no specimen.   Endobronchial findings:  Trachea: Normal mucosa Carina: Normal mucosa Right main bronchus: Normal mucosa Right upper lobe bronchus: Normal mucosa Right upper lobe bronchus: Normal mucosa Right upper lobe bronchus: Normal mucosa Left main bronchus: Mass, and bronchus intermedius, about 20 mm Left upper lobe bronchus: Edematous mucosa Left lower lobe bronchus: Edematous mucosa  The Patient was taken to the Endoscopy Recovery area in satisfactory condition.  Attestation: I performed the procedure.  [name] L

## 2011-11-10 ENCOUNTER — Encounter (HOSPITAL_COMMUNITY): Payer: Self-pay | Admitting: Pulmonary Disease

## 2011-11-21 ENCOUNTER — Other Ambulatory Visit (HOSPITAL_COMMUNITY): Payer: Self-pay | Admitting: Pulmonary Disease

## 2011-11-21 DIAGNOSIS — C3490 Malignant neoplasm of unspecified part of unspecified bronchus or lung: Secondary | ICD-10-CM

## 2011-11-27 ENCOUNTER — Encounter (HOSPITAL_COMMUNITY): Payer: Self-pay | Admitting: Oncology

## 2011-11-27 ENCOUNTER — Encounter (HOSPITAL_COMMUNITY): Payer: Medicare Other | Attending: Oncology | Admitting: Oncology

## 2011-11-27 VITALS — BP 112/66 | HR 79 | Temp 97.5°F | Resp 20 | Ht 64.0 in | Wt 110.7 lb

## 2011-11-27 DIAGNOSIS — C3490 Malignant neoplasm of unspecified part of unspecified bronchus or lung: Secondary | ICD-10-CM | POA: Insufficient documentation

## 2011-11-27 DIAGNOSIS — J4489 Other specified chronic obstructive pulmonary disease: Secondary | ICD-10-CM | POA: Insufficient documentation

## 2011-11-27 DIAGNOSIS — J449 Chronic obstructive pulmonary disease, unspecified: Secondary | ICD-10-CM

## 2011-11-27 DIAGNOSIS — Z853 Personal history of malignant neoplasm of breast: Secondary | ICD-10-CM

## 2011-11-27 DIAGNOSIS — Z87891 Personal history of nicotine dependence: Secondary | ICD-10-CM

## 2011-11-27 DIAGNOSIS — C3430 Malignant neoplasm of lower lobe, unspecified bronchus or lung: Secondary | ICD-10-CM

## 2011-11-27 LAB — COMPREHENSIVE METABOLIC PANEL
CO2: 27 mEq/L (ref 19–32)
Calcium: 11.3 mg/dL — ABNORMAL HIGH (ref 8.4–10.5)
Creatinine, Ser: 0.81 mg/dL (ref 0.50–1.10)
GFR calc Af Amer: 80 mL/min — ABNORMAL LOW (ref >90)
GFR calc non Af Amer: 69 mL/min — ABNORMAL LOW (ref >90)
Glucose, Bld: 82 mg/dL (ref 70–99)
Total Protein: 7.2 g/dL (ref 6.0–8.3)

## 2011-11-27 NOTE — Patient Instructions (Addendum)
[facility]  Discharge Instructions  RECOMMENDATIONS MADE BY THE CONSULTANT AND ANY TEST RESULTS WILL BE SENT TO YOUR REFERRING DOCTOR.   EXAM FINDINGS BY MD TODAY AND SIGNS AND SYMPTOMS TO REPORT TO CLINIC OR PRIMARY MD: exam and discussion bu Dr. Mariel Sleet.  Need to get some additional information.  Will get MRI of your brain and will get you set up to be seen by the Radiation Oncologist at [facility].  MEDICATIONS PRESCRIBED: none   INSTRUCTIONS GIVEN AND DISCUSSED: Other :  Let us know if you want to get something to help you with your appetite. When you go for the PET scan - Nothing to eat or drink 6 hours prior to the scan. (Especially nothing with sugar)  SPECIAL INSTRUCTIONS/FOLLOW-UP: Lab work Needed today, Xray Studies Needed :  PET Scan and MRI of Brain and Return to Clinic in 2 weeks to see PA in follow-up.   I acknowledge that I have been informed and understand all the instructions given to me and received a copy. I do not have any more questions at this time, but understand that I may call the Specialty Clinic at [facility] at [phone] during business hours should I have any further questions or need assistance in obtaining follow-up care.    __________________________________________  _____________  __________ Signature of Patient or Authorized Representative            Date                   Time    __________________________________________ Nurse's Signature

## 2011-11-27 NOTE — Progress Notes (Signed)
[name] presented for labwork. Labs per MD order drawn via Peripheral Line 23 gauge needle inserted in left AC  Good blood return present. Procedure without incident.  Needle removed intact. Patient tolerated procedure well.

## 2011-11-27 NOTE — Progress Notes (Signed)
Problem #1 squamous cell carcinoma of the lung which is either stage IIIB, or possibly stage IIIa, though metastatic stage IV disease has not been excluded yet. Problem #2 COPD secondary to long-standing smoking history Problem #3 history of breast cancer 20+ years ago treated with chemotherapy and surgical resection Problem #4 benign tumor of the right supraclavicular area treated with surgical resection followed by radiation therapy although those records are not available for these last 2 problems. Problem #5 bilateral cataract operations with lens implants Problem #6 thyroid surgery x2 in the past for benign disease Problem #720 pound weight loss in the last 6-12 months accompanied by anorexia decreased sense of taste secondary to #1 This is a very pleasant 78 year old Caucasian woman who is accompanied by one son and one daughter and a daughter-in-law who sometime in the last 6-12 months started losing her appetite started losing weight, and started developing a cough sometime the last one to 3 months. In the last 3-4 weeks she has started noticing intermittent hoarseness. She had flank pain which prompted her physician to get a CT scan of her abdomen and pelvis. Nothing was there to account for the flank pain but left lower lobe collapse was noticed and CT scan of the chest was recommended which revealed adenopathy in the left hilar region along with a mass contiguous with the AP window lymph node bearing area and a suspicious nodes in the mediastinum on the left side and possibly in the subcarinal area. She had bronchoscopy by Dr. Juanetta Gosling which revealed squamous cell carcinoma. She has back pain which is described as more left-sided but it is lower chest area around T8-T9. A PET scan is scheduled for this Wednesday.  Past medical history is remarkable for smoking a pack of cigarettes a day starting is young teenager and quitting approximately 20 years ago. She is not a drinker. She is divorced. She  has 4 children. She worked at [employer] here in [place] area for many years.  she denies hemoptysis, sputum production presently. She has developed the last several weeks numbness of the right hand and lower right arm. She denies headaches. No seizures. She has no visual changes but she is hoarse intermittently and is significantly hoarse today. She is not aware of any lumps other than in the right supraclavicular area she has thickening which she states is stable times many years but where this "tumor" was resected by orthopedic surgeon followed by radiation therapy at [facility] many many years ago.  She is edentulous. She is not having trouble swallowing but she has no appetite. Bowel function is normal GU function is normal.  Her vital signs are recorded she is 5 feet 4 inches tall weighs a 110 pounds. Her BMI is 19. Blood pressure is 112/66 left arm sitting position. Pulse 80 and regular. Respirations 20 and unlabored. She coughs occasionally during the exam. She had 8 fullness and tender area in the right subclavicular fossa possibly 2 x 2 centimeters but it was not clear whether this was scar or lymph node. She has no adenopathy anywhere else. Her lungs show diminished breath sounds on the left in particular at the base and slightly decreased on the right. There no rubs. There no rales. She has no wheezes. She had a normal heart exam without murmur rub or gallop. Breast exam showed no masses in the left breast or the right chest wall is clear. Her abdomen was soft and nontender without organomegaly. Bowel sounds are diminished. She  has no peripheral edema. Dorsalis pedis pulses were 2+ and symmetrical but absent posterior tibialis pulses were noted. She has no edema of the arms or legs. She had eye changes of prior cataract operations. She had a full head of hair. Facial symmetry was intact. The hoarseness was impressive. I could not detect changes in strength in her hand grips I think she had  minor changes in her muscle mass in the biceps and deltoids on the right compared to the left  She appears to be unresectable with at least T4 disease in my opinion. PET scan is to be done Wednesday and we will arrange for an MRI of her brain and a radiation therapy consultation. She may be a candidate for radiation therapy with or without concomitant sensitizing chemotherapy. We discussed all this with her and her family.

## 2011-11-28 ENCOUNTER — Telehealth (HOSPITAL_COMMUNITY): Payer: Self-pay | Admitting: *Deleted

## 2011-11-28 ENCOUNTER — Other Ambulatory Visit (HOSPITAL_COMMUNITY): Payer: Self-pay | Admitting: Family Medicine

## 2011-11-28 DIAGNOSIS — Z139 Encounter for screening, unspecified: Secondary | ICD-10-CM

## 2011-11-29 ENCOUNTER — Ambulatory Visit (HOSPITAL_COMMUNITY)
Admission: RE | Admit: 2011-11-29 | Discharge: 2011-11-29 | Disposition: A | Discharge status: Home / Self Care | Payer: Medicare Other | Source: Ambulatory Visit | Attending: Pulmonary Disease | Admitting: Pulmonary Disease

## 2011-11-29 DIAGNOSIS — J438 Other emphysema: Secondary | ICD-10-CM | POA: Insufficient documentation

## 2011-11-29 DIAGNOSIS — I700 Atherosclerosis of aorta: Secondary | ICD-10-CM | POA: Insufficient documentation

## 2011-11-29 DIAGNOSIS — I2510 Atherosclerotic heart disease of native coronary artery without angina pectoris: Secondary | ICD-10-CM | POA: Insufficient documentation

## 2011-11-29 DIAGNOSIS — R911 Solitary pulmonary nodule: Secondary | ICD-10-CM | POA: Insufficient documentation

## 2011-11-29 DIAGNOSIS — N289 Disorder of kidney and ureter, unspecified: Secondary | ICD-10-CM | POA: Insufficient documentation

## 2011-11-29 DIAGNOSIS — R599 Enlarged lymph nodes, unspecified: Secondary | ICD-10-CM | POA: Insufficient documentation

## 2011-11-29 DIAGNOSIS — C3430 Malignant neoplasm of lower lobe, unspecified bronchus or lung: Secondary | ICD-10-CM | POA: Insufficient documentation

## 2011-11-29 DIAGNOSIS — C3490 Malignant neoplasm of unspecified part of unspecified bronchus or lung: Secondary | ICD-10-CM

## 2011-11-29 MED ORDER — FLUDEOXYGLUCOSE F - 18 (FDG) INJECTION
18.1000 | Freq: Once | INTRAVENOUS | Status: AC | PRN
  Administered 2011-11-29: 18.1 via INTRAVENOUS

## 2011-11-30 ENCOUNTER — Ambulatory Visit (HOSPITAL_COMMUNITY)
Admission: RE | Admit: 2011-11-30 | Discharge: 2011-11-30 | Disposition: A | Discharge status: Home / Self Care | Payer: Medicare Other | Source: Ambulatory Visit | Attending: Oncology | Admitting: Oncology

## 2011-11-30 ENCOUNTER — Telehealth (HOSPITAL_COMMUNITY): Payer: Self-pay | Admitting: Oncology

## 2011-11-30 DIAGNOSIS — R930 Abnormal findings on diagnostic imaging of skull and head, not elsewhere classified: Secondary | ICD-10-CM | POA: Insufficient documentation

## 2011-11-30 DIAGNOSIS — C3490 Malignant neoplasm of unspecified part of unspecified bronchus or lung: Secondary | ICD-10-CM

## 2011-11-30 DIAGNOSIS — R209 Unspecified disturbances of skin sensation: Secondary | ICD-10-CM | POA: Insufficient documentation

## 2011-11-30 MED ORDER — GADOBENATE DIMEGLUMINE 529 MG/ML IV SOLN
10.0000 mL | Freq: Once | INTRAVENOUS | Status: AC | PRN
  Administered 2011-11-30: 10 mL via INTRAVENOUS

## 2011-11-30 NOTE — Telephone Encounter (Signed)
I personally reviewed and went over radiographic studies with the patient.  Radiation oncology appointment tomorrow at 815 AM.  Dellis Anes

## 2011-12-04 ENCOUNTER — Other Ambulatory Visit: Payer: Self-pay | Admitting: Radiation Therapy

## 2011-12-04 ENCOUNTER — Ambulatory Visit (HOSPITAL_COMMUNITY): Payer: Medicare Other

## 2011-12-04 DIAGNOSIS — C7931 Secondary malignant neoplasm of brain: Secondary | ICD-10-CM

## 2011-12-05 ENCOUNTER — Ambulatory Visit (HOSPITAL_COMMUNITY): Payer: Medicare Other

## 2011-12-08 ENCOUNTER — Ambulatory Visit
Admission: RE | Admit: 2011-12-08 | Discharge: 2011-12-08 | Disposition: A | Discharge status: Home / Self Care | Payer: Medicare Other | Source: Ambulatory Visit | Attending: Radiation Oncology | Admitting: Radiation Oncology

## 2011-12-08 DIAGNOSIS — C7931 Secondary malignant neoplasm of brain: Secondary | ICD-10-CM

## 2011-12-08 MED ORDER — GADOBENATE DIMEGLUMINE 529 MG/ML IV SOLN
10.0000 mL | Freq: Once | INTRAVENOUS | Status: AC | PRN
  Administered 2011-12-08: 10 mL via INTRAVENOUS

## 2011-12-11 ENCOUNTER — Ambulatory Visit
Admission: RE | Admit: 2011-12-11 | Discharge: 2011-12-11 | Disposition: A | Discharge status: Home / Self Care | Payer: Medicare Other | Source: Ambulatory Visit | Attending: Radiation Oncology | Admitting: Radiation Oncology

## 2011-12-11 ENCOUNTER — Encounter: Payer: Self-pay | Admitting: Radiation Oncology

## 2011-12-11 VITALS — BP 110/70 | HR 63 | Temp 98.3°F | Resp 18 | Ht 64.0 in | Wt 110.5 lb

## 2011-12-11 DIAGNOSIS — I739 Peripheral vascular disease, unspecified: Secondary | ICD-10-CM | POA: Insufficient documentation

## 2011-12-11 DIAGNOSIS — C801 Malignant (primary) neoplasm, unspecified: Secondary | ICD-10-CM

## 2011-12-11 DIAGNOSIS — C7931 Secondary malignant neoplasm of brain: Secondary | ICD-10-CM | POA: Insufficient documentation

## 2011-12-11 DIAGNOSIS — J449 Chronic obstructive pulmonary disease, unspecified: Secondary | ICD-10-CM | POA: Insufficient documentation

## 2011-12-11 DIAGNOSIS — C3430 Malignant neoplasm of lower lobe, unspecified bronchus or lung: Secondary | ICD-10-CM | POA: Insufficient documentation

## 2011-12-11 DIAGNOSIS — Z510 Encounter for antineoplastic radiation therapy: Secondary | ICD-10-CM | POA: Insufficient documentation

## 2011-12-11 DIAGNOSIS — Z853 Personal history of malignant neoplasm of breast: Secondary | ICD-10-CM | POA: Insufficient documentation

## 2011-12-11 DIAGNOSIS — Z79899 Other long term (current) drug therapy: Secondary | ICD-10-CM | POA: Insufficient documentation

## 2011-12-11 DIAGNOSIS — J4489 Other specified chronic obstructive pulmonary disease: Secondary | ICD-10-CM | POA: Insufficient documentation

## 2011-12-11 DIAGNOSIS — Z87891 Personal history of nicotine dependence: Secondary | ICD-10-CM | POA: Insufficient documentation

## 2011-12-11 NOTE — Progress Notes (Signed)
Patient presents to the clinic today accompanied by her family for a consult with Dr. Basilio Cairo. Patient is alert and oriented to person, place, and time. No distress noted. Steady gait noted. Pleasant affect noted. Patient denies pain at this time. Patient denies recurrent headaches. Patient reports occasional dizziness. Patient reports constant nausea but, denies emesis. Patient reports a constant metallic taste in her mouth. Patient reports that deters her from eating much. Patient reports she weighed in at 130 a year ago but, is now down to 110. Patient stopped smoking some 20 plus years ago. Patient has a raspy voice. Patient reports a persistent dry cough. Patient reports that within the past week related to allergies her cough has become productive occasionally with yellow sputum without blood. Patient reports a decline in her energy level. Patient reports her energy level began to fade a year ago with diagnosis of COPD. Shortness of breath with exertion. Patient denies diplopia. Patient denies floaters. Reported all findings to Dr. Basilio Cairo.   Denies having a pacemaker Radiation ZO:XWRUE subclavicular area Patient denies having a power port

## 2011-12-11 NOTE — Progress Notes (Signed)
Removed IV heplock from left antecubital, ,I placed 2x2 x2 gause over site, taped with paper tape,secured, instructed patient to keep dressing on till in the am,can take off after shower, she is on  Blood thinner, patient gave verbal understanding\ No c/o pain, no bleeding noted 10:50 AM

## 2011-12-11 NOTE — Progress Notes (Signed)
  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  Identity was confirmed.  All relevant records and images related to the planned course of therapy were reviewed.  The patient freely provided informed written consent to proceed with treatment after reviewing the details related to the planned course of therapy. The consent form was witnessed and verified by the simulation staff. Intravenous access was established for contrast administration. Then, the patient was set-up in a stable reproducible supine position for radiation therapy.  A relocatable thermoplastic stereotactic head frame was fabricated for precise immobilization.  CT images were obtained.  Surface markings were placed.  The CT images were loaded into the planning software and fused with the patient's targeting MRI scan.  Then the target and avoidance structures were contoured.  Treatment planning then occurred.  The radiation prescription was entered and confirmed.  I have requested 3D planning  I have requested a DVH of the following structures: Brain stem, brain, left eye, right eye, lenses, optic chiasm, target volumes, uninvolved brain, and normal tissue.    PLAN:  The patient will receive 20 Gy in 1 fraction to her left parietal brain metastasis.  -----------------------------------  Lonie Peak, MD

## 2011-12-11 NOTE — Progress Notes (Signed)
Complete PATIENT MEASURE OF DISTRESS worksheet with a score of 4 submitted to social work.  

## 2011-12-11 NOTE — Progress Notes (Signed)
Started a 24 gauge IV on the first attempt in the patient's left AC. Obtained great blood return and site flushed without difficulty. Patient tolerated well. Secured IV site. Escorted patient to CT/SIM.

## 2011-12-11 NOTE — Progress Notes (Signed)
Radiation Oncology         [phone] ________________________________  Initial outpatient Consultation  Name: [name] MRN: [id number]  Date: 12/11/2011  DOB: 1933/10/31  WU:[id number],NWGN CABOT, MD  Lurline Hare, MD  REFERRING PHYSICIAN: Lurline Hare, MD  DIAGNOSIS: Stage IV squamous cell carcinoma of the lung, with a single brain metastasis  HISTORY OF PRESENT ILLNESS::[name] is a 78 y.o. female with a fairly complicated history including a past history of a right supraclavicular tumor which the patient reports was benign. She reports that she receive 15 treatments at [facility], radiation treatments, about 35 years ago. She has had no progression in this area since then. She also reports a history of breast cancer which was treated over 20 years ago with chemotherapy and surgery. She's not had any recurrences of that either.   She was recently diagnosis squamous cell carcinoma of the lung. The patient had progressive shortness of breath over the past year which was refractory to COPD treatments. She also has been losing weight and experiencing a cough and hoarseness. The most notable symptom which is bothering her with some flank pain which she thought was due to a urinary tract infection. She underwent a CT scan of the abdomen and pelvis which showed left lower lung collapse. This was followed by a CT scan of the chest which demonstrated adenopathy in the chest, including left lower lobe collapse. Bronchoscopy and biopsies on 11/09/2011 was performed and the bronchial brushings of the left lower lobe demonstrated malignant cells consistent with squamous cell carcinoma. She underwent a PET scan on 11/29/2011 which demonstrated a large left lower lobe mass which is 4.1 x 2.8 cm with ipsilateral hilar mediastinal and supraclavicular lymphadenopathy in addition to a contralateral right upper lobe pulmonary nodule. There is no obvious evidence of distant metastatic  disease. No activity within the skeleton suspicious for metastases. She does demonstrate hypermetabolic activity within the right side of the vocal cords without a definable mass in that area. She is currently receiving radiotherapy to her chest in [place], [place]. She has received 4 of 10 treatments  She underwent MRI of her brain on 11/30/2011. It demonstrate a 9.1 x 7.7 x 10.3 mm ring-enhancing lesion in the left anterior parietal parasagittal axis. This was followed by a 3T MRI of the brain on 12/08/2011 which was also without contrast. This reaffirms a solitary ring enhancing tumor that is 1 x 1.2 x 1.1 cm in the same region. This appears to be at the sensory strip - the patient does acknowledge some numbness throughout her arm and hand which is been going on for about a month.  The patient has not yet met with neurosurgery but a referral has been made.  She denies headaches, visual changes, nausea. She reports poor taste in her mouth which inhibits her ability to eat. She has not yet seen a nutritionist.  PREVIOUS RADIATION THERAPY: Yes as above  PAST MEDICAL HISTORY:  has a past medical history of COPD (chronic obstructive pulmonary disease); Cancer; Peripheral vascular disease; Hilar mass; and Breast cancer.    PAST SURGICAL HISTORY: Past Surgical History  Procedure Date  . Mastectomy     rt side  . Abdominal hysterectomy   . Flexible bronchoscopy 11/09/2011    Procedure: FLEXIBLE BRONCHOSCOPY;  Surgeon: Fredirick Maudlin, MD;  Location: [facility];  Service: Pulmonary;  Laterality: Left;  . Thyroidectomy   . External ear surgery   . Neck mass excision  1960's    FAMILY HISTORY: family history includes Cancer in her brother and sister and Clotting disorder in her brother.  SOCIAL HISTORY:  reports that she has quit smoking. Her smoking use included Cigarettes. She has quit using smokeless tobacco. Her smokeless tobacco use included Snuff. She reports that she does not drink  alcohol or use illicit drugs.  ALLERGIES: Sulfa antibiotics; Neosporin; Aspirin; and Codeine  MEDICATIONS:  Current Outpatient Prescriptions  Medication Sig Dispense Refill  . albuterol (PROVENTIL) (2.5 MG/3ML) 0.083% nebulizer solution Take 2.5 mg by nebulization every 4 (four) hours as needed. Shortness of Breath      . budesonide-formoterol (SYMBICORT) 160-4.5 MCG/ACT inhaler Inhale 2 puffs into the lungs 2 (two) times daily.      . Cholecalciferol (VITAMIN D PO) Take 1 tablet by mouth every morning. Vitamin D3 2000 units daily      . clopidogrel (PLAVIX) 75 MG tablet Take 75 mg by mouth every evening.       Tery Sanfilippo Calcium (STOOL SOFTENER PO) Take 1 capsule by mouth every morning.      . fish oil-omega-3 fatty acids 1000 MG capsule Take 1 g by mouth every morning.      Marland Kitchen levothyroxine (SYNTHROID, LEVOTHROID) 75 MCG tablet Take 75 mcg by mouth every morning.       Marland Kitchen omeprazole (PRILOSEC) 20 MG capsule Take 20 mg by mouth daily as needed. Acid Reflux      . polyethylene glycol powder (GLYCOLAX/MIRALAX) powder Take 17 g by mouth daily as needed. Constipation      . rosuvastatin (CRESTOR) 20 MG tablet Take 20 mg by mouth every evening.      . tiotropium (SPIRIVA) 18 MCG inhalation capsule Place 18 mcg into inhaler and inhale daily.        REVIEW OF SYSTEMS:  A complete review of systems was obtained and is notable for that above. Otherwise negative.     PHYSICAL EXAM:  height is 5\' 4"  (1.626 m) and weight is 110 lb 8 oz (50.122 kg). Her oral temperature is 98.3 F (36.8 C). Her blood pressure is 110/70 and her pulse is 63. Her respiration is 18.   General: Alert and oriented, in no acute distress. Hoarse voice. Cachectic. HEENT: Head is normocephalic. Pupils are equally round and reactive to light. Extraocular movements are intact. Oropharynx is clear. She wears dentures Neck: Neck is supple, with a palpable mass in the right supraclavicular region Heart: Regular in rate and rhythm with  no murmurs, rubs, or gallops. Chest: Clear to auscultation bilaterally, with no rhonchi, wheezes, or rales. Abdomen: Soft, nontender, nondistended, with no rigidity or guarding. Extremities: No cyanosis or edema. Lymphatics: As above  Skin: No concerning lesions. Musculoskeletal: symmetric strength and muscle tone throughout. 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 She reports numbness in her right arm and hand.  Speech is fluent. Coordination is intact. Psychiatric: Judgment and insight are intact. Affect is appropriate. She is somewhat tangential when giving her history.   LABORATORY DATA:  Lab Results  Component Value Date   WBC 7.8 11/01/2011   HGB 12.2 11/01/2011   HCT 37.1 11/01/2011   MCV 88.3 11/01/2011   PLT 218 11/01/2011    CMP     Component Value Date/Time   NA 136 11/27/2011 1717   K 3.8 11/27/2011 1717   CL 99 11/27/2011 1717   CO2 27 11/27/2011 1717   GLUCOSE 82 11/27/2011  1717   BUN 13 11/27/2011 1717   CREATININE 0.81 11/27/2011 1717   CALCIUM 11.3* 11/27/2011 1717   PROT 7.2 11/27/2011 1717   ALBUMIN 4.0 11/27/2011 1717   AST 22 11/27/2011 1717   ALT 12 11/27/2011 1717   ALKPHOS 91 11/27/2011 1717   BILITOT 0.4 11/27/2011 1717   GFRNONAA 69* 11/27/2011 1717   GFRAA 80* 11/27/2011 1717      RADIOGRAPHY: Mr Laqueta Jean Wo Contrast  12/08/2011  *RADIOLOGY REPORT*  Clinical Data: Lung cancer.  Right hand and arm numbness.  Breast cancer.  MRI HEAD WITHOUT AND WITH CONTRAST  Technique:  Multiplanar, multiecho pulse sequences of the brain and surrounding structures were obtained according to standard protocol without and with intravenous contrast  Contrast: 10mL MULTIHANCE GADOBENATE DIMEGLUMINE 529 MG/ML IV SOLN  Comparison: 11/30/2011.  Findings: Solitary ring enhancing 1 x 1.2 x 1.1 cm lesion.  On the axial images this appears to be in the medial aspect of the anterior parietal lobe (sensory strip) however, on  the sagittal imaging, this may be within the medial aspect of motor strip.  The patient presented with numbness rather than weakness suggesting this lesion is within the sensory strip. This is suspicious for a solitary intracranial metastatic lesion.  The appearance is not typical for abscess on diffusion sequence.  Primary brain tumor secondary less likely consideration.  Inflammatory felt unlikely.  No other intracranial enhancing lesions suspicious for metastatic disease noted.  No acute infarct.  No intracranial hemorrhage.  Mild small vessel disease type changes.  Polypoid opacification maxillary sinuses.  Opacification left sphenoid sinus which may contain inspissated material.  Fungal disease can have a similar MR appearance.  Major intracranial vascular structures are patent with small right vertebral artery.  IMPRESSION: Solitary ring enhancing lesion appears to be within the anterior left parietal lobe (sensory strip) as discussed above.  Findings suspicious for solitary intracranial metastatic lesions given the patient's history.  Please see above discussion   Original Report Authenticated By: Fuller Canada, M.D.    Mr Laqueta Jean Wo Contrast  11/30/2011  *RADIOLOGY REPORT*  Clinical Data:  History of right hand and arm numbness.  Recent diagnosis of lung carcinoma.  Previous history of breast carcinoma  MRI HEAD WITHOUT AND WITH CONTRAST  Technique:  Multiplanar, multiecho pulse sequences of the brain and surrounding structures were obtained according to standard protocol without and with intravenous contrast  Contrast: 10mL MULTIHANCE GADOBENATE DIMEGLUMINE 529 MG/ML IV SOLN  Comparison: MRI scan of the brain of 05/01/2007.  Findings:  A 9.1 mm x 7.7 mm x 10.3 mm ring enhancing lesion is seen in the left anterior parietal parasagittal axis.  Minimal adjacent vasogenic edema is seen .  Gray-white matter differentiation is normal otherwise.  The sella and the clivus demonstrate normal signal and   morphology.  The cerebellar tonsils are at the level of the foramen magnum.  The odontoid process, predental space and the prevertebral soft tissues are  within normal limits.  Aaxial FLAIR and T2-weighted images demonstrate scattered foci of signal hyperintensity  in the subcortical white matter bifrontally, the centrum semi ovale  and the biparietal subcortical white matter.  Ventricles are normal.  Mastoids demonstrates patchy soft tissue signal .  Internal auditory canals are symmetrical in signal and morphology.  Flow voids are maintained in the major vessels at the cranial skull base.  Mild thickening of the mucosa in the ethmoid air cells, the maxillary sinuses and the sphenoid sinus is seen.  Lobulated thickening of the mucosa in the maxillary sinuses is noted.  Bilateral eye lens  surgery is  suggested in the orbits.  No abnormal blood breakdown products seen.  Dural venous sinuses are widely patent.  Impression 1.  A 10.3-mm by 9.1 mm x 7.7 mm enhancing cortical based left anterior parietal parasagittal lesion, highly suspicious of metastatic disease given the patient's history. 2. Non specific subcortical white matter changes supratentorially probably represent sequela of chronic microvascular ischemia. 3.  Moderate lobulated inflammatory thickening of the mucosa in the maxillary sinuses.   Original Report Authenticated By: Oneal Grout, M.D.    Nm Pet Image Initial (pi) Skull Base To Thigh  11/29/2011  *RADIOLOGY REPORT*  Clinical Data: Initial treatment strategy for lung mass.  NUCLEAR MEDICINE PET SKULL BASE TO THIGH  Fasting Blood Glucose:  92  Technique:  18.1 mCi F-18 FDG was injected intravenously. CT data was obtained and used for attenuation correction and anatomic localization only.  (This was not acquired as a diagnostic CT examination.) Additional exam technical data entered on technologist worksheet.  Comparison:  Chest CT 10/31/2011.  Abdomen and pelvis CT 10/30/2011.  Findings:   Neck: No hypermetabolic lymph nodes in the neck.  However, there is very focal hypermetabolic activity (SUVmax = 12.7) associated with the right side of the vocal cords (without a definable mass like area).  Chest:  New enlarged left supraclavicular lymph node measuring 17 mm in short axis is hypermetabolic (SUVmax = 14.6)several enlarged and hypermetabolic prevascular lymph nodes, the largest of which measures 13 mm in short axis (SUVmax = 10.0).  Ill-defined nodal mass in the AP window measuring approximately 2.8 x 2.1 cm (SUVmax = 19.2).  Large left lower lobe mass in the perihilar region is difficult to discretely visualized on the noncontrast CT examination, but measures approximately 2.8 x 4.1 cm and is markedly hypermetabolic (SUVmax = 23.4). This mass causes obstruction with complete collapse of the left lower lobe.  There is a trace amount of left-sided pleural fluid.  Some mild peripheral subpleural reticulation is noted throughout portions of the left upper lobe and right lower lobe.  There is an ill-defined focus of ground-glass attenuation in the posterior aspect of the left upper lobe which measures approximately 13 mm and is similar to the recent prior study.  There is also a small 8 mm spiculated nodule in the apex of the right upper lobe which appears to have increased slightly in size compared to the prior examination.  No definite hypermetabolism identified associated with this nodule. Background of mild paraseptal emphysema. Heart size is borderline enlarged. There is atherosclerosis of the thoracic aorta, the great vessels of the mediastinum and the coronary arteries, including calcified atherosclerotic plaque in the left main, left anterior descending, left circumflex and right coronary arteries.  Status post right-sided modified radical mastectomy and axillary nodal dissection, without evidence of focal soft tissue mass, right axillary adenopathy or hypermetabolism in these areas to suggest  local recurrence of disease.  Abdomen/Pelvis:  No abnormal hypermetabolic activity within the liver, pancreas, adrenal glands, or spleen.  No hypermetabolic lymph nodes in the abdomen or pelvis. 2.7 cm right renal lesion is low with attenuation and likely represents a cyst.  Extensive atherosclerosis of the abdominal and pelvic vasculature, without definite aneurysm.  Status post total abdominal hysterectomy and bilateral salpingo-oophorectomy.  Skeleton:  No focal hypermetabolic activity to suggest skeletal metastasis.  IMPRESSION: 1.  Large left lower lobe mass measuring approximately 4.1 x 2.8 cm  with ipsilateral hilar, mediastinal and supraclavicular lymphadenopathy, in addition to the contralateral right upper lobe pulmonary nodule which has a suspicious appearance and is enlarging.  In addition, there is abnormal hypermetabolism in the right side of the vocal cord apparatus (without a definable mass). At the very least, assuming that this represents a primary lung cancer these findings are concerning for T2a, N3 disease, and possibly M1 disease (i.e., at least stage IIIB disease, if not stage IV). 2.  There is a possibility that the vocal cords activity is physiologic, however, the high level hypermetabolism and asymmetry is highly unusual.  This should be ammenable to direct inspection. 3.  No findings to suggest distant metastatic disease to the abdomen or pelvis. 4. Atherosclerosis, including left main and three-vessel coronary artery disease. 5.  Additional incidental findings, as above.   Original Report Authenticated By: Florencia Reasons, M.D.       IMPRESSION/PLAN: This a very pleasant 27 old woman with stage IV squamous cell carcinoma of the lung with a single brain metastasis.  This appears to be at the sensory strip - the patient does acknowledge some numbness throughout her arm and hand which is been going on for about a month.  She also has hoarseness, corresponding with hypermetabolic  activity in the right larynx. She is going to see otolaryngology in [place] for this. I explained to the patient and her family that there is some chance that there could hoarseness due to be involvement of the recurrent laryngeal nerve by her chest adenopathy and therefore she may not actually have a second primary in the larynx. The right larynx could be hypermetabolic due to overactivity of the right vocal cord to compensate for paralysis of the left. However, this will be worked up further by Dr. Andrey Campanile.   I had a lengthy discussion with the patient and her children after reviewing her MRI with them.  They're interested in treatment for her single brain metastasis.  We spoke about whole brain radiotherapy versus stereotactic radiosurgery to the brain. We spoke about the differing risks benefits and side effects of both of these treatments. During part of our discussion, we spoke about the cognitive side effects and fatigue that can result from whole brain radiotherapy and we spoke about radionecrosis that can result from stereotactic radiosurgery. I explained that whole brain radiotherapy is more comprehensive and therefore can decrease the chance of recurrences elsewhere in the brain while stereotactic radiosurgery only treats the areas of gross disease and spares the rest of the brain parenchyma.  After lengthy discussion, the patient and her family would like to proceed with stereotactic radiosurgery to the single brain metastasis.  We will perform her treatment planning simulation session today. A consent form was signed and placed in her chart. An appointment has been scheduled with Dr. Newell Coral of neurosurgery to participate in the radiosurgery.  Of note, in light of her weight loss, I recommended that the patient request seeing a nutritionist at [facility]. She can discuss this further with medical oncology.  .__________________________________________   Lonie Peak, MD

## 2011-12-11 NOTE — Addendum Note (Signed)
Encounter addended by: Desmen Schoffstall Mintz Rieley Hausman, RN on: 12/11/2011  7:05 PM<BR>     Documentation filed: Charges VN

## 2011-12-11 NOTE — Progress Notes (Signed)
See progress noted under physician encounter.  

## 2011-12-12 ENCOUNTER — Ambulatory Visit (HOSPITAL_COMMUNITY): Payer: Medicare Other | Admitting: Oncology

## 2011-12-15 ENCOUNTER — Ambulatory Visit
Admission: RE | Admit: 2011-12-15 | Discharge: 2011-12-15 | Disposition: A | Discharge status: Home / Self Care | Payer: Medicare Other | Source: Ambulatory Visit | Attending: Radiation Oncology | Admitting: Radiation Oncology

## 2011-12-15 ENCOUNTER — Encounter: Payer: Self-pay | Admitting: Radiation Oncology

## 2011-12-15 VITALS — BP 122/53 | Temp 97.6°F

## 2011-12-15 DIAGNOSIS — C7931 Secondary malignant neoplasm of brain: Secondary | ICD-10-CM

## 2011-12-15 DIAGNOSIS — C7949 Secondary malignant neoplasm of other parts of nervous system: Secondary | ICD-10-CM

## 2011-12-15 NOTE — Progress Notes (Signed)
[facility] Radiation Oncology End of Treatment Note  Name:[name]  Date: 12/15/2011 ZOX:[id number] DOB:09-20-1933   Status:outpatient    DIAGNOSIS: Brain metastasis   INDICATION FOR TREATMENT: Palliative   TREATMENT DATES: 12-15-11   SITE/DOSE:     Left  parietal lobe 12 mm target  /20 Gray in 1 fraction                  BEAMS/ENERGY:      Stereotactic radiosurgery/6 MV photons, flattening filter free            NARRATIVE:      The patient tolerated her treatment very well without any complications.                      PLAN: Routine followup in one month. Patient instructed to call if questions or worsening complaints in interim.

## 2011-12-15 NOTE — Progress Notes (Signed)
SP SRS treatment to Brain.  In recliner presently with no voiced complaints.  VSS.  Has dry cough which has been present for 3oths.  She has COPD and using a nebulizer at home.  O2 Sat 1005 today on room air.  Discharged to home with family. No voiced concerns and no untoward reactions to her SRS procedure.

## 2011-12-15 NOTE — Progress Notes (Signed)
  Radiation Oncology         [phone] ________________________________  Stereotactic Treatment Procedure Note  Name: [name] MRN: [id number]  Date: 12/15/2011  DOB: 09-14-1933  SPECIAL TREATMENT PROCEDURE  3D TREATMENT PLANNING AND DOSIMETRY:  The patient's radiation plan was reviewed and approved by neurosurgery and radiation oncology prior to treatment.  It showed 3-dimensional radiation distributions overlaid onto the planning CT/MRI image set.  The [facility] for the target structures as well as the organs at risk were reviewed. The documentation of the 3D plan and dosimetry are filed in the radiation oncology EMR.  NARRATIVE:  [name] was brought to the TrueBeam stereotactic radiation treatment machine and placed supine on the CT couch. The head frame was applied, and the patient was set up for stereotactic radiosurgery.  Neurosurgery was present for the set-up and delivery  SIMULATION VERIFICATION:  In the couch zero-angle position, the patient underwent Exactrac imaging using the Brainlab system with orthogonal KV images.  These were carefully aligned and repeated to confirm treatment position for each of the isocenters.  The Exactrac snap film verification was repeated at each couch angle.  SPECIAL TREATMENT PROCEDURE: [name] received stereotactic radiosurgery to the following targets: Left parietal target was treated using 4 Circular Arcs to a prescription dose of 20 Gy.  ExacTrac Snap verification was performed for each couch angle.  This constitutes a special treatment procedure due to the ablative dose delivered and the technical nature of treatment.  This highly technical modality of treatment ensures that the ablative dose is centered on the patient's tumor while sparing normal tissues from excessive dose and risk of detrimental effects.  STEREOTACTIC TREATMENT MANAGEMENT:  Following delivery, the patient was transported to nursing in stable condition and monitored  for possible acute effects.  Vital signs were recorded BP 122/53  Temp 97.6 F (36.4 C)  SpO2 100%. The patient tolerated treatment without significant acute effects, and was discharged to home in stable condition.    PLAN: Follow-up in one month.  ________________________________   Lonie Peak, MD

## 2011-12-18 ENCOUNTER — Encounter (HOSPITAL_COMMUNITY): Payer: Medicare Other | Attending: Oncology | Admitting: Oncology

## 2011-12-18 ENCOUNTER — Encounter (HOSPITAL_COMMUNITY): Payer: Self-pay | Admitting: Oncology

## 2011-12-18 VITALS — BP 111/65 | HR 69 | Temp 98.0°F | Resp 18 | Wt 112.4 lb

## 2011-12-18 DIAGNOSIS — C3490 Malignant neoplasm of unspecified part of unspecified bronchus or lung: Secondary | ICD-10-CM | POA: Insufficient documentation

## 2011-12-18 DIAGNOSIS — Z0000 Encounter for general adult medical examination without abnormal findings: Secondary | ICD-10-CM | POA: Insufficient documentation

## 2011-12-18 DIAGNOSIS — C7931 Secondary malignant neoplasm of brain: Secondary | ICD-10-CM

## 2011-12-18 DIAGNOSIS — C7949 Secondary malignant neoplasm of other parts of nervous system: Secondary | ICD-10-CM

## 2011-12-18 DIAGNOSIS — C3430 Malignant neoplasm of lower lobe, unspecified bronchus or lung: Secondary | ICD-10-CM

## 2011-12-18 DIAGNOSIS — Z23 Encounter for immunization: Secondary | ICD-10-CM

## 2011-12-18 DIAGNOSIS — R109 Unspecified abdominal pain: Secondary | ICD-10-CM | POA: Insufficient documentation

## 2011-12-18 DIAGNOSIS — Z853 Personal history of malignant neoplasm of breast: Secondary | ICD-10-CM

## 2011-12-18 DIAGNOSIS — K219 Gastro-esophageal reflux disease without esophagitis: Secondary | ICD-10-CM | POA: Insufficient documentation

## 2011-12-18 DIAGNOSIS — R52 Pain, unspecified: Secondary | ICD-10-CM | POA: Insufficient documentation

## 2011-12-18 MED ORDER — INFLUENZA VIRUS VACC SPLIT PF IM SUSP
0.5000 mL | Freq: Once | INTRAMUSCULAR | Status: AC
  Administered 2011-12-18: 0.5 mL via INTRAMUSCULAR

## 2011-12-18 NOTE — Patient Instructions (Addendum)
[facility]  Discharge Instructions  RECOMMENDATIONS MADE BY THE CONSULTANT AND ANY TEST RESULTS WILL BE SENT TO YOUR REFERRING DOCTOR.   We will get you an appointment with Dr.Bradford for a port-a-cath placement. We will schedule you to come back for chemotherapy teaching within 1-2 weeks. We will plan to start chemotherapy in 2 weeks. We will have you see Tom after your first chemotherapy treatment.   I acknowledge that I have been informed and understand all the instructions given to me and received a copy. I do not have any more questions at this time, but understand that I may call the Specialty Clinic at [facility] at [phone] during business hours should I have any further questions or need assistance in obtaining follow-up care.    __________________________________________  _____________  __________ Signature of Patient or Authorized Representative            Date                   Time    __________________________________________ Nurse's Signature

## 2011-12-18 NOTE — Progress Notes (Addendum)
Jennifer Ribas, MD [address] [place] [address]  1. Squamous cell carcinoma lung     CURRENT THERAPY: S/P stereotactic radiosurgery on 12/15/2011.  Now undergoing palliative radiation to lung lesion(s).  INTERVAL HISTORY: [name] 78 y.o. 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   [name] tolerated the stereotactic radiosurgery on 12/15/2011 and is tolerating palliative radiation therapy by Dr. Lonie Peak.  She is due to complete therapy this Thursday (12/21/2011).  Due to her voice hoarseness and vocal cord hypermetabolic activity, she is due to see Dr. Andrey Campanile, ENT, on Wednesday.  Her hoarseness may be secondary to involvement of her recurrent laryngeal nerve as Dr. Basilio Cairo mentioned.    Now that she is near completion of radiation, it is time to prep for palliative chemotherapy consisting of carboplatin and taxol.  This will be administered every 21 days.  In light of her past chemotherapy for breat cancer many years ago, I think it is wise to start with a reduced dose of an AUC of 5 and Taxol at 175 mg/m2.  We can always go up on the dose if tolerated well.  We discussed the risks, benefits, alternatives, and side effects of chemotherapy including alopecia, lowering of blood counts, increased risk of infection, peripheral neuropathy, nausea, vomiting, diarrhea, constipation, and death to name a few.  We will get her an appointment for chemotherapy teaching.  She is agreeable to pursue therapy.  We will administer 3 cycles of chemotherapy and perform restaging scans.   She will require a port-a-cath for chemotherapy.  When asked if she has a Careers adviser in town she responds, "Dr. Malvin Johns is my man."  So we will refer her to Dr. Malvin Johns for port-a-cath insertion.   She asks when se can have the flu shot and it is appropriate for her to have one in preparation for chemotherapy  and its side effects.  Therefore, we will give her one today.   Thus far, she has tolerated radiation therapy well and denies no complaints other than the above mentioned.   Complete ROS questioning is negative.   She understands that therapy is palliative only and she is not curable.   I personally reviewed and went over radiographic studies with the patient.  Past Medical History  Diagnosis Date  . COPD (chronic obstructive pulmonary disease)   . Cancer     breast  . Peripheral vascular disease   . Hilar mass     left lung  . Breast cancer   . Squamous cell carcinoma lung     breast     has COPD (chronic obstructive pulmonary disease); Abdominal pain, acute; Collapsed lung; Squamous cell carcinoma lung; Lung mass; and Single Brain Metastasis on her problem list.     is allergic to sulfa antibiotics; neosporin; aspirin; and codeine.  [name] does not currently have medications on file.  Past Surgical History  Procedure Date  . Mastectomy     rt side  . Abdominal hysterectomy   . Flexible bronchoscopy 11/09/2011    Procedure: FLEXIBLE BRONCHOSCOPY;  Surgeon: Fredirick Maudlin, MD;  Location: [facility];  Service: Pulmonary;  Laterality: Left;  . Thyroidectomy   . External ear surgery   . Neck mass excision     1960's    Denies any headaches, dizziness, double vision, fevers, chills, night sweats, nausea, vomiting, diarrhea, constipation, chest pain,  heart palpitations, shortness of breath, blood in stool, black tarry stool, urinary pain, urinary burning, urinary frequency, hematuria.   PHYSICAL EXAMINATION  ECOG PERFORMANCE STATUS: 2 - Symptomatic, <50% confined to bed  Filed Vitals:   12/18/11 1400  BP: 111/65  Pulse: 69  Temp: 98 F (36.7 C)  Resp: 18    GENERAL:alert, no distress, well developed, comfortable, cooperative and smiling SKIN: skin color, texture, turgor are normal, no rashes or significant lesions HEAD: Normocephalic, No masses, lesions, tenderness  or abnormalities EYES: normal, Conjunctiva are pink and non-injected EARS: External ears normal OROPHARYNX:lips, buccal mucosa, and tongue normal and mucous membranes are moist  NECK: supple, trachea midline LYMPH:  not examined BREAST:not examined LUNGS: clear to auscultation and percussion, decreased breath sounds, inspiratory wheezes bilaterally, diffusely. HEART: regular rate & rhythm, no murmurs, no gallops, S1 normal and S2 normal ABDOMEN:abdomen soft, non-tender and normal bowel sounds BACK: Back symmetric, no curvature. EXTREMITIES:less then 2 second capillary refill, no joint deformities, effusion, or inflammation, no edema, no skin discoloration, no clubbing, no cyanosis  NEURO: alert & oriented x 3 with fluent speech, no focal motor/sensory deficits, gait normal   LABORATORY DATA: CBC    Component Value Date/Time   WBC 7.8 11/01/2011 0503   RBC 4.20 11/01/2011 0503   HGB 12.2 11/01/2011 0503   HCT 37.1 11/01/2011 0503   PLT 218 11/01/2011 0503   MCV 88.3 11/01/2011 0503   MCH 29.0 11/01/2011 0503   MCHC 32.9 11/01/2011 0503   RDW 13.6 11/01/2011 0503   LYMPHSABS 2.0 10/30/2011 1542   MONOABS 0.5 10/30/2011 1542   EOSABS 0.2 10/30/2011 1542   BASOSABS 0.0 10/30/2011 1542      Chemistry      Component Value Date/Time   NA 136 11/27/2011 1717   K 3.8 11/27/2011 1717   CL 99 11/27/2011 1717   CO2 27 11/27/2011 1717   BUN 13 11/27/2011 1717   CREATININE 0.81 11/27/2011 1717      Component Value Date/Time   CALCIUM 11.3* 11/27/2011 1717   ALKPHOS 91 11/27/2011 1717   AST 22 11/27/2011 1717   ALT 12 11/27/2011 1717   BILITOT 0.4 11/27/2011 1717      RADIOGRAPHIC STUDIES:  12/08/2011  *RADIOLOGY REPORT*  Clinical Data: Lung cancer. Right hand and arm numbness. Breast  cancer.  MRI HEAD WITHOUT AND WITH CONTRAST  Technique: Multiplanar, multiecho pulse sequences of the brain and  surrounding structures were obtained according to standard protocol  without and with intravenous  contrast  Contrast: 10mL MULTIHANCE GADOBENATE DIMEGLUMINE 529 MG/ML IV SOLN  Comparison: 11/30/2011.  Findings: Solitary ring enhancing 1 x 1.2 x 1.1 cm lesion. On the  axial images this appears to be in the medial aspect of the  anterior parietal lobe (sensory strip) however, on the sagittal  imaging, this may be within the medial aspect of motor strip. The  patient presented with numbness rather than weakness suggesting  this lesion is within the sensory strip. This is suspicious for a  solitary intracranial metastatic lesion. The appearance is not  typical for abscess on diffusion sequence. Primary brain tumor  secondary less likely consideration. Inflammatory felt unlikely.  No other intracranial enhancing lesions suspicious for metastatic  disease noted.  No acute infarct.  No intracranial hemorrhage.  Mild small vessel disease type changes.  Polypoid opacification maxillary sinuses. Opacification left  sphenoid sinus which may contain inspissated material. Fungal  disease can have a similar MR appearance.  Major intracranial vascular structures  are patent with small right  vertebral artery.  IMPRESSION:  Solitary ring enhancing lesion appears to be within the anterior  left parietal lobe (sensory strip) as discussed above. Findings  suspicious for solitary intracranial metastatic lesions given the  patient's history. Please see above discussion  Original Report Authenticated By: Fuller Canada, M.D.     11/29/2011  *RADIOLOGY REPORT*  Clinical Data: Initial treatment strategy for lung mass.  NUCLEAR MEDICINE PET SKULL BASE TO THIGH  Fasting Blood Glucose: 92  Technique: 18.1 mCi F-18 FDG was injected intravenously. CT data  was obtained and used for attenuation correction and anatomic  localization only. (This was not acquired as a diagnostic CT  examination.) Additional exam technical data entered on  technologist worksheet.  Comparison: Chest CT 10/31/2011. Abdomen  and pelvis CT  10/30/2011.  Findings:  Neck: No hypermetabolic lymph nodes in the neck. However, there is  very focal hypermetabolic activity (SUVmax = 12.7) associated with  the right side of the vocal cords (without a definable mass like  area).  Chest: New enlarged left supraclavicular lymph node measuring 17  mm in short axis is hypermetabolic (SUVmax = 14.6)several enlarged  and hypermetabolic prevascular lymph nodes, the largest of which  measures 13 mm in short axis (SUVmax = 10.0). Ill-defined nodal  mass in the [facility] window measuring approximately 2.8 x 2.1 cm (SUVmax  = 19.2). Large left lower lobe mass in the perihilar region is  difficult to discretely visualized on the noncontrast CT  examination, but measures approximately 2.8 x 4.1 cm and is  markedly hypermetabolic (SUVmax = 23.4). This mass causes  obstruction with complete collapse of the left lower lobe. There  is a trace amount of left-sided pleural fluid. Some mild  peripheral subpleural reticulation is noted throughout portions of  the left upper lobe and right lower lobe. There is an ill-defined  focus of ground-glass attenuation in the posterior aspect of the  left upper lobe which measures approximately 13 mm and is similar  to the recent prior study. There is also a small 8 mm spiculated  nodule in the apex of the right upper lobe which appears to have  increased slightly in size compared to the prior examination. No  definite hypermetabolism identified associated with this nodule.  Background of mild paraseptal emphysema. Heart size is borderline  enlarged. There is atherosclerosis of the thoracic aorta, the great  vessels of the mediastinum and the coronary arteries, including  calcified atherosclerotic plaque in the left main, left anterior  descending, left circumflex and right coronary arteries. Status  post right-sided modified radical mastectomy and axillary nodal  dissection, without evidence of focal  soft tissue mass, right  axillary adenopathy or hypermetabolism in these areas to suggest  local recurrence of disease.  Abdomen/Pelvis: No abnormal hypermetabolic activity within the  liver, pancreas, adrenal glands, or spleen. No hypermetabolic  lymph nodes in the abdomen or pelvis. 2.7 cm right renal lesion is  low with attenuation and likely represents a cyst. Extensive  atherosclerosis of the abdominal and pelvic vasculature, without  definite aneurysm. Status post total abdominal hysterectomy and  bilateral salpingo-oophorectomy.  Skeleton: No focal hypermetabolic activity to suggest skeletal  metastasis.  IMPRESSION:  1. Large left lower lobe mass measuring approximately 4.1 x 2.8 cm  with ipsilateral hilar, mediastinal and supraclavicular  lymphadenopathy, in addition to the contralateral right upper lobe  pulmonary nodule which has a suspicious appearance and is  enlarging. In addition, there is abnormal hypermetabolism  in the  right side of the vocal cord apparatus (without a definable mass).  At the very least, assuming that this represents a primary lung  cancer these findings are concerning for T2a, N3 disease, and  possibly M1 disease (i.e., at least stage IIIB disease, if not  stage IV).  2. There is a possibility that the vocal cords activity is  physiologic, however, the high level hypermetabolism and asymmetry  is highly unusual. This should be ammenable to direct inspection.  3. No findings to suggest distant metastatic disease to the  abdomen or pelvis.  4. Atherosclerosis, including left main and three-vessel coronary  artery disease.  5. Additional incidental findings, as above.  Original Report Authenticated By: Florencia Reasons, M.D.    PATHOLOGY: 11/09/2011  Diagnosis Lung, biopsy, left lower lobe - SMALL FRAGMENT OF NECROTIC TISSUE. - REACTIVE BRONCHIAL EPITHELIUM WITH UNDERLYING STROMA FIBROSIS AND HEMORRHAGE. Microscopic Comment No definitive  evidence of atypia or malignancy identified in this material. Please correlate with concurrent cytology reports [id number] and 129. (HCL:caf 11/10/11) Abigail Miyamoto MD Pathologist, Electronic Signature (Case signed 11/10/2011)     Adequacy Reason Satisfactory For Evaluation. Diagnosis BRONCHIAL BRUSHING, LEFT LOWER LOBE: MALIGNANT CELLS CONSISTENT WITH SQUAMOUS CELL CARCINOMA. Abigail Miyamoto MD Pathologist, Electronic Signature (Case signed 11/10/2011)    ASSESSMENT:  1. Stage IV metastatic squamous cell carcinoma to brain.  S/P stereotactic radiosurgery on 12/15/2011.  No undergoing palliative radiation therapy.  2. H/O Breast cancer in the past requiring mastectomy and chemotherapy.  3. COPD 4. Decreased appetite without weight loss, no intervention needed at this time.   PLAN:  1. I personally reviewed and went over radiographic studies with the patient. 2. Discussion regarding chemotherapy.  We discussed the risks, benefits, alternatives and side effects of therapy.  3. Patient agreeable to pursue palliative chemotherapy.  4. Treatment plan built consisting of Carbo AUC 5 /Taxol 175 mg/m2 (dose reduction) q 21 days with neulasta support.  Will start with a dose reduced chemotherapy plan in light of her history of chemotherapy in past for breast cancer.  5. Will administer 3 of chemotherapy and then perform restaging scans.  6. Referral to Gen Surg for port-a-cath insertion. 7. Chemotherapy teaching this week of next week. 8. Anticipate chemotherapy beginning in 2 weeks allowing time for port-a-cath insertion, chemoteaching, and completion of radiation therapy on 10/10. 9. Pre-chemotherapy lab work ordered.  10.  Return in 1 month for follow-up.   All questions were answered. The patient knows to call the clinic with any problems, questions or concerns. We can certainly see the patient much sooner if necessary.  KEFALAS,THOMAS

## 2011-12-20 ENCOUNTER — Encounter (HOSPITAL_COMMUNITY): Payer: Self-pay | Admitting: Pharmacy Technician

## 2011-12-21 ENCOUNTER — Encounter (HOSPITAL_COMMUNITY)
Admission: RE | Admit: 2011-12-21 | Discharge: 2011-12-21 | Disposition: A | Discharge status: Home / Self Care | Payer: Medicare Other | Source: Ambulatory Visit | Attending: General Surgery | Admitting: General Surgery

## 2011-12-21 ENCOUNTER — Encounter (HOSPITAL_COMMUNITY): Payer: Self-pay

## 2011-12-21 HISTORY — DX: Hypothyroidism, unspecified: E03.9

## 2011-12-21 LAB — BASIC METABOLIC PANEL
BUN: 17 mg/dL (ref 6–23)
CO2: 29 mEq/L (ref 19–32)
Calcium: 11.7 mg/dL — ABNORMAL HIGH (ref 8.4–10.5)
Creatinine, Ser: 0.81 mg/dL (ref 0.50–1.10)

## 2011-12-21 LAB — CBC WITH DIFFERENTIAL/PLATELET
Basophils Relative: 1 % (ref 0–1)
Eosinophils Absolute: 0.1 10*3/uL (ref 0.0–0.7)
HCT: 37.4 % (ref 36.0–46.0)
Hemoglobin: 12.3 g/dL (ref 12.0–15.0)
Lymphs Abs: 0.8 10*3/uL (ref 0.7–4.0)
MCH: 28.7 pg (ref 26.0–34.0)
MCHC: 32.9 g/dL (ref 30.0–36.0)
MCV: 87.4 fL (ref 78.0–100.0)
Monocytes Absolute: 0.7 10*3/uL (ref 0.1–1.0)
Monocytes Relative: 11 % (ref 3–12)
RBC: 4.28 MIL/uL (ref 3.87–5.11)

## 2011-12-21 NOTE — Patient Instructions (Addendum)
20 [name]  12/21/2011   Your procedure is scheduled on:  Tuesday,October 15  Report to [facility] at [id number].  Call this number if you have problems the morning of surgery: [phone]   Remember:   Do not eat food:After Midnight.  May have clear liquids:until Midnight .  Clear liquids include soda, tea, black coffee, apple or grape juice, broth.  Take these medicines the morning of surgery with A SIP OF WATER: Albuterol nebulizer,symbicort,levothyrozine,omeprazole,spiriva   Do not wear jewelry, make-up or nail polish.  Do not wear lotions, powders, or perfumes. You may wear deodorant.  Do not shave 48 hours prior to surgery. Men may shave face and neck.  Do not bring valuables to the hospital.  Contacts, dentures or bridgework may not be worn into surgery.  Leave suitcase in the car. After surgery it may be brought to your room.  For patients admitted to the hospital, checkout time is 11:00 AM the day of discharge.   Patients discharged the day of surgery will not be allowed to drive home.  Name and phone number of your driver: Family and/or friends  Special Instructions: Shower using CHG 2 nights before surgery and the night before surgery.  If you shower the day of surgery use CHG.  Use special wash - you have one bottle of CHG for all showers.  You should use approximately 1/3 of the bottle for each shower.   Please read over the following fact sheets that you were given: Pain Booklet, Coughing and Deep Breathing, MRSA Information, Surgical Site Infection Prevention, Anesthesia Post-op Instructions and Care and Recovery After Surgery  PATIENT INSTRUCTIONS POST-ANESTHESIA  IMMEDIATELY FOLLOWING SURGERY:  Do not drive or operate machinery for the first twenty four hours after surgery.  Do not make any important decisions for twenty four hours after surgery or while taking narcotic pain medications or sedatives.  If you develop intractable nausea and vomiting or a severe headache  please notify your doctor immediately.  FOLLOW-UP:  Please make an appointment with your surgeon as instructed. You do not need to follow up with anesthesia unless specifically instructed to do so.  WOUND CARE INSTRUCTIONS (if applicable):  Keep a dry clean dressing on the anesthesia/puncture wound site if there is drainage.  Once the wound has quit draining you may leave it open to air.  Generally you should leave the bandage intact for twenty four hours unless there is drainage.  If the epidural site drains for more than 36-48 hours please call the anesthesia department.  QUESTIONS?:  Please feel free to call your physician or the hospital operator if you have any questions, and they will be happy to assist you.       Implanted Port Instructions An implanted port is a central line that has a round shape and is placed under the skin. It is used for long-term IV (intravenous) access for:  Medicine.  Fluids.  Liquid nutrition, such as TPN (total parenteral nutrition).  Blood samples. Ports can be placed:  In the chest area just below the collarbone (this is the most common place.)  In the arms.  In the belly (abdomen) area.  In the legs. PARTS OF THE PORT A port has 2 main parts:  The reservoir. The reservoir is round, disc-shaped, and will be a small, raised area under your skin.  The reservoir is the part where a needle is inserted (accessed) to either give medicines or to draw blood.  The catheter. The catheter  is a long, slender tube that extends from the reservoir. The catheter is placed into a large vein.  Medicine that is inserted into the reservoir goes into the catheter and then into the vein. INSERTION OF THE PORT  The port is surgically placed in either an operating room or in a procedural area (interventional radiology).  Medicine may be given to help you relax during the procedure.  The skin where the port will be inserted is numbed (local anesthetic).  1 or  2 small cuts (incisions) will be made in the skin to insert the port.  The port can be used after it has been inserted. INCISION SITE CARE  The incision site may have small adhesive strips on it. This helps keep the incision site closed. Sometimes, no adhesive strips are placed. Instead of adhesive strips, a special kind of surgical glue is used to keep the incision closed.  If adhesive strips were placed on the incision sites, do not take them off. They will fall off on their own.  The incision site may be sore for 1 to 2 days. Pain medicine can help.  Do not get the incision site wet. Bathe or shower as directed by your caregiver.  The incision site should heal in 5 to 7 days. A small scar may form after the incision has healed. ACCESSING THE PORT Special steps must be taken to access the port:  Before the port is accessed, a numbing cream can be placed on the skin. This helps numb the skin over the port site.  A sterile technique is used to access the port.  The port is accessed with a needle. Only "non-coring" port needles should be used to access the port. Once the port is accessed, a blood return should be checked. This helps ensure the port is in the vein and is not clogged (clotted).  If your caregiver believes your port should remain accessed, a clear (transparent) bandage will be placed over the needle site. The bandage and needle will need to be changed every week or as directed by your caregiver.  Keep the bandage covering the needle clean and dry. Do not get it wet. Follow your caregiver's instructions on how to take a shower or bath when the port is accessed.  If your port does not need to stay accessed, no bandage is needed over the port. FLUSHING THE PORT Flushing the port keeps it from getting clogged. How often the port is flushed depends on:  If a constant infusion is running. If a constant infusion is running, the port may not need to be flushed.  If intermittent  medicines are given.  If the port is not being used. For intermittent medicines:  The port will need to be flushed:  After medicines have been given.  After blood has been drawn.  As part of routine maintenance.  A port is normally flushed with:  Normal saline.  Heparin.  Follow your caregiver's advice on how often, how much, and the type of flush to use on your port. IMPORTANT PORT INFORMATION  Tell your caregiver if you are allergic to heparin.  After your port is placed, you will get a manufacturer's information card. The card has information about your port. Keep this card with you at all times.  There are many types of ports available. Know what kind of port you have.  In case of an emergency, it may be helpful to wear a medical alert bracelet. This can help alert health  care workers that you have a port.  The port can stay in for as long as your caregiver believes it is necessary.  When it is time for the port to come out, surgery will be done to remove it. The surgery will be similar to how the port was put in.  If you are in the hospital or clinic:  Your port will be taken care of and flushed by a nurse.  If you are at home:  A home health care nurse may give medicines and take care of the port.  You or a family member can get special training and directions for giving medicine and taking care of the port at home. SEEK IMMEDIATE MEDICAL CARE IF:   Your port does not flush or you are unable to get a blood return.  New drainage or pus is coming from the incision.  A bad smell is coming from the incision site.  You develop swelling or increased redness at the incision site.  You develop increased swelling or pain at the port site.  You develop swelling or pain in the surrounding skin near the port.  You have an oral temperature above 102 F (38.9 C), not controlled by medicine. MAKE SURE YOU:   Understand these instructions.  Will watch your  condition.  Will get help right away if you are not doing well or get worse. Document Released: 02/27/2005 Document Revised: 05/22/2011 Document Reviewed: 05/21/2008 [facility] Patient Information 2013 [place], [place].

## 2011-12-24 MED ORDER — PROCHLORPERAZINE 25 MG RE SUPP: 25.0000 mg | Freq: Four times a day (QID) | RECTAL | Status: DC | PRN

## 2011-12-24 NOTE — Patient Instructions (Addendum)
[facility] [name]  DOB 11-30-1933 CSN [id number]  MRN [id number] [name]    CHEMOTHERAPY INSTRUCTIONS  Carboplatin - this medication can be hard on your kidneys - this is why we need you to drink 64 oz of fluid (preferably water/decaff fluids) 2 days prior to chemo and for up to 4-5 days after chemo. Drink more if you can. This will help to keep your kidneys flushed. This can cause mild hair loss, lower your platelets (which make your blood clot), lower your white blood cells (fight infection), and cause nausea/vomiting.  (This is a 30 minute infusion).   Taxol - the first time you receive this drug we will titrate it very slowly to ensure that you do not have or are not having an allergic reaction to the drug. You will also be responsible for taking a steroid called Dexamethasone before and after chemo. This is to reduce your risk of having an allergic reaction to the Taxol. Side Effects: hair loss, lowers your white blood cells (fight infection), muscle aches, nausea/vomiting, irritation to the mouth (mouth sores, pain in your mouth) *neuropathy - numbness/tingling/burning in hands/fingers/feet/toes. We need to know as soon as this begins to happen so that we can monitor it and treat if necessary. The numbness generally begins in the fingertips or tips of toes and then begins to travel up the finger/toe/hand/foot. We never want you getting to where you can't pick up a pen, coin, zip a zipper, button a button, or have trouble walking. You must tell us immediately if you are experiencing peripheral neuropathy! (this will take 3 hours to infuse on each visit after the initial visit).  Neulasta - this is not chemo but being given to you because you have had chemo. This medication works by stimulating your bone marrow which is inside of your bones to produce more white blood cells. White blood cells protect [place] from infection. Chemo destroys your cancer  cells and white blood cells and that is why you need the Neulasta. This medication will be given to you by a nurse in the Cancer Center. It is given 20-24 hours from the completion of chemo. It comes in the form of an injection. It will be administered into the fatty tissue of your abdomen. The most common side effect associated with this injection is bone pain. The bone pain can occur as soon as a few hours after the injection to several days after the injection. Some people have mild bone pain and some have none. Few people experience severe bone pain. We want and need to know how you respond to this injection. If you develop moderate to severe bone pain - we need to know! The drug of choice to help relieve the bone pain is Aleve. Take as bottle directs. If another doctor has told you to avoid NSAIDs - like Aleve or Ibuprofen, then you may take Tylenol. You may also apply a heating pad to the affected areas.   POTENTIAL SIDE EFFECTS OF TREATMENT: Increased Susceptibility to Infection, Vomiting, Constipation, Hair Thinning, Changes in Character of Skin and Nails (brittleness, dryness,etc.), Pigment Changes (darkening of veins, nail beds, palms of hands, soles of feet, etc.), Bone Marrow Suppression, Abdominal Cramping, Urinary Frequency, Complete Hair Loss, Nausea, Diarrhea and Mouth Sores   SELF IMAGE NEEDS AND REFERRALS MADE: Obtain hair accessories as soon as possible (wigs, scarves, turbans,caps,etc.) and Referral to Look Good, Feel Better consultant (info given)   EDUCATIONAL MATERIALS  GIVEN AND REVIEWED: Chemotherapy and You printout Specific Instruction Sheets from [employer] database: Carboplatin/Taxol, Dexamethasone, Zofran, EMLA cream   SELF CARE ACTIVITIES WHILE ON CHEMOTHERAPY: Increase your fluid intake 48 hours prior to treatment and drink at least 2 quarts per day after treatment., No alcohol intake., No aspirin or other medications unless approved by your oncologist., Eat foods that are  light and easy to digest., Eat foods at cold or room temperature., No fried, fatty, or spicy foods immediately before or after treatment., Have teeth cleaned professionally before starting treatment. Keep dentures and partial plates clean., Use soft toothbrush and do not use mouthwashes that contain alcohol. Biotene is a good mouthwash that is available at most pharmacies or may be ordered by calling [phone]., Use warm salt water gargles (1 teaspoon salt per 1 quart warm water) before and after meals and at bedtime. Or you may rinse with 2 tablespoons of three -percent hydrogen peroxide mixed in eight ounces of water., Always use sunscreen with SPF (Sun Protection Factor) of 30 or higher., Use your nausea medication as directed to prevent nausea., Use your stool softener or laxative as directed to prevent constipation. and Use your anti-diarrheal medication as directed to stop diarrhea.  Please wash your hands for at least 30 seconds using warm soapy water. Handwashing is the #1 way to prevent the spread of germs. Stay away from sick people or people who are getting over a cold. If you develop respiratory systems such as green/yellow mucus production or productive cough or persistent cough let us know and we will see if you need an antibiotic. It is a good idea to keep a pair of gloves on when going into grocery stores/[employer] to decrease your risk of coming into contact with germs on the carts, etc. Carry alcohol hand gel with you at all times and use it frequently if out in public. All foods need to be cooked thoroughly. No raw foods. No medium or undercooked meats, eggs. If your food is cooked medium well, it does not need to be hot pink or saturated with bloody liquid at all. Vegetables and fruits need to be washed/rinsed under the faucet with a dish detergent before being consumed. You can eat raw fruits and vegetables unless we tell you otherwise but it would be best if you cooked them or bought  frozen. Do not eat off of salad bars or hot bars unless you really trust the cleanliness of the restaurant. If you need dental work, please let Dr. Mariel Sleet know before you go for your appointment so that we can coordinate the best possible time for you in regards to your chemo regimen. You need to also let your dentist know that you are actively taking chemo. We may need to do labs prior to your dental appointment. We also want your bowels moving at least every other day. If this is not happening, we need to know so that we can get you on a bowel regimen to help you go.     MEDICATIONS: You have been given prescriptions for the following medications:  Dexamethasone 4mg  tablet. Starting the night before chemo, take 2 tablets @ 9pm. Then take 2 tablets @ 3am. Starting the day after chemo, take 2 tablets in the am and 2 tablets in the pm for 2 days. (You will be taking this to reduce your risk of having an allergic reaction the Taxol. It is very important that you take this as prescribed. This medication will also help to  decrease abdominal cramping that could occur from the Taxol, generally make you feel better, potentially boost your energy for a couple of days, and decrease nausea/vomiting). Other side effects: moody, emotional, irritable, trouble sleeping. You will also be receiving this before chemo in your IV therefore there is potential that you may develop a reddened appearance in your face, neck, chest, shoulders. This will go away after a couple of days. This is a reaction that sometimes occurs to this medication.   Zofran 8mg  tablet. Starting the day after chemo, take 1 tablet in the am and 1 tablet in the pm for 2 days. Then may take 1 tablet two times a day IF needed for nausea/vomiting. Side Effect - constipation.   Compazine 25mg  suppository. Insert 1 suppository in rectum every 6 hours IF needed for nausea/vomiting. (insert gently and apply vaseline or KY jelly to the suppository before  inserting).   EMLA cream. Apply a quarter sized amount to port site 1 hour prior to chemo. Do not rub in. Cover with plastic.   Ativan 0.5mg  tablet. May take 1 to 2 tablets every 6 hours IF needed for nausea/vomiting. This medication may make you sleepy and/or unsteady on your feet.   Over-the Counter Meds:  Colace - this is a stool softener. Take 100mg  capsule 2-6 times a day as needed. If you have to take more than 6 capsules of Colace a day call the Cancer Center.  Senna - this is a mild laxative used to treat mild constipation. May take 2 tabs by mouth daily or up to twice a day as needed for mild constipation.  Milk of Magnesia - this is a laxative used to treat moderate to severe constipation. May take 2-4 tablespoons every 8 hours as needed. May increase to 8 tablespoons x 1 dose and if no bowel movement call the Cancer Center.  Imodium - this is for diarrhea. Take 2 tabs after 1st loose stool and then 1 tab after each loose stool until you go a total of 12 hours without a loose stool. Call Cancer Center if loose stools continue.   SYMPTOMS TO REPORT AS SOON AS POSSIBLE AFTER TREATMENT:  FEVER GREATER THAN 100.5 F  CHILLS WITH OR WITHOUT FEVER  NAUSEA AND VOMITING THAT IS NOT CONTROLLED WITH YOUR NAUSEA MEDICATION  UNUSUAL SHORTNESS OF BREATH  UNUSUAL BRUISING OR BLEEDING  TENDERNESS IN MOUTH AND THROAT WITH OR WITHOUT PRESENCE OF ULCERS  URINARY PROBLEMS  BOWEL PROBLEMS  UNUSUAL RASH    Wear comfortable clothing and clothing appropriate for easy access to any Portacath or PICC line. Let us know if there is anything that we can do to make your therapy better!      I have been informed and understand all of the instructions given to me and have received a copy. I have been instructed to call the clinic [phone] or my family physician as soon as possible for continued medical care, if indicated. I do not have any more questions at this time but understand that I  may call the Cancer Center or the Patient Navigator at [phone] during office hours should I have questions or need assistance in obtaining follow-up care.      _________________________________________      _______________     __________ Signature of Patient or Authorized Representative        Date  Time      _________________________________________ Nurse's Signature

## 2011-12-25 ENCOUNTER — Encounter (HOSPITAL_BASED_OUTPATIENT_CLINIC_OR_DEPARTMENT_OTHER): Payer: Medicare Other

## 2011-12-25 DIAGNOSIS — C3490 Malignant neoplasm of unspecified part of unspecified bronchus or lung: Secondary | ICD-10-CM

## 2011-12-25 DIAGNOSIS — C3430 Malignant neoplasm of lower lobe, unspecified bronchus or lung: Secondary | ICD-10-CM

## 2011-12-25 NOTE — Progress Notes (Signed)
Chemo teaching done and consent signed for Carbo/Taxol. Pt's 2 sons and 1 of her daughters were here and present for chemo teaching. Copies made of the chemo teaching and distributed among them. Meds faxed into [place]. Ativan called into [facility]. Son stated that he was going to make sure she got her Dex @ 9pm the night before and 3 am the morning of chemo and get her port numbed.

## 2011-12-26 ENCOUNTER — Encounter (HOSPITAL_COMMUNITY): Payer: Self-pay | Admitting: *Deleted

## 2011-12-26 ENCOUNTER — Ambulatory Visit (HOSPITAL_COMMUNITY): Payer: Medicare Other

## 2011-12-26 ENCOUNTER — Ambulatory Visit (HOSPITAL_COMMUNITY)
Admission: RE | Admit: 2011-12-26 | Discharge: 2011-12-26 | Disposition: A | Discharge status: Home / Self Care | Payer: Medicare Other | Source: Ambulatory Visit | Attending: General Surgery | Admitting: General Surgery

## 2011-12-26 ENCOUNTER — Ambulatory Visit (HOSPITAL_COMMUNITY): Payer: Medicare Other | Admitting: Anesthesiology

## 2011-12-26 ENCOUNTER — Encounter (HOSPITAL_COMMUNITY)
Admission: RE | Disposition: A | Discharge status: Home / Self Care | Payer: Self-pay | Source: Ambulatory Visit | Attending: General Surgery

## 2011-12-26 ENCOUNTER — Encounter (HOSPITAL_COMMUNITY): Payer: Self-pay | Admitting: Anesthesiology

## 2011-12-26 DIAGNOSIS — R918 Other nonspecific abnormal finding of lung field: Secondary | ICD-10-CM

## 2011-12-26 DIAGNOSIS — Z01812 Encounter for preprocedural laboratory examination: Secondary | ICD-10-CM | POA: Insufficient documentation

## 2011-12-26 DIAGNOSIS — J9819 Other pulmonary collapse: Secondary | ICD-10-CM

## 2011-12-26 DIAGNOSIS — J4489 Other specified chronic obstructive pulmonary disease: Secondary | ICD-10-CM | POA: Insufficient documentation

## 2011-12-26 DIAGNOSIS — C3490 Malignant neoplasm of unspecified part of unspecified bronchus or lung: Secondary | ICD-10-CM | POA: Insufficient documentation

## 2011-12-26 DIAGNOSIS — R109 Unspecified abdominal pain: Secondary | ICD-10-CM

## 2011-12-26 DIAGNOSIS — Z01810 Encounter for preprocedural cardiovascular examination: Secondary | ICD-10-CM | POA: Insufficient documentation

## 2011-12-26 DIAGNOSIS — J449 Chronic obstructive pulmonary disease, unspecified: Secondary | ICD-10-CM | POA: Insufficient documentation

## 2011-12-26 DIAGNOSIS — C7949 Secondary malignant neoplasm of other parts of nervous system: Secondary | ICD-10-CM

## 2011-12-26 HISTORY — PX: PORTACATH PLACEMENT: SHX2246

## 2011-12-26 LAB — SURGICAL PCR SCREEN: MRSA, PCR: NEGATIVE

## 2011-12-26 SURGERY — INSERTION, TUNNELED CENTRAL VENOUS DEVICE, WITH PORT
Anesthesia: Monitor Anesthesia Care | Site: Chest | Laterality: Left | Wound class: Clean

## 2011-12-26 MED ORDER — MIDAZOLAM HCL 2 MG/2ML IJ SOLN
INTRAMUSCULAR | Status: AC
  Filled 2011-12-26: qty 2

## 2011-12-26 MED ORDER — LACTATED RINGERS IV SOLN
INTRAVENOUS | Status: DC
  Administered 2011-12-26: 1000 mL via INTRAVENOUS

## 2011-12-26 MED ORDER — PROPOFOL INFUSION 10 MG/ML OPTIME
INTRAVENOUS | Status: DC | PRN
  Administered 2011-12-26: 25 ug/kg/min via INTRAVENOUS

## 2011-12-26 MED ORDER — HEPARIN SOD (PORK) LOCK FLUSH 100 UNIT/ML IV SOLN
INTRAVENOUS | Status: DC | PRN
  Administered 2011-12-26: 400 [IU] via INTRAVENOUS

## 2011-12-26 MED ORDER — MIDAZOLAM HCL 5 MG/5ML IJ SOLN
INTRAMUSCULAR | Status: DC | PRN
  Administered 2011-12-26 (×2): 1 mg via INTRAVENOUS

## 2011-12-26 MED ORDER — LIDOCAINE HCL (PF) 1 % IJ SOLN
INTRAMUSCULAR | Status: DC | PRN
  Administered 2011-12-26: 7 mL

## 2011-12-26 MED ORDER — FENTANYL CITRATE 0.05 MG/ML IJ SOLN: 25.0000 ug | INTRAMUSCULAR | Status: DC | PRN

## 2011-12-26 MED ORDER — ONDANSETRON HCL 4 MG/2ML IJ SOLN: 4.0000 mg | Freq: Once | INTRAMUSCULAR | Status: DC | PRN

## 2011-12-26 MED ORDER — MUPIROCIN 2 % EX OINT
TOPICAL_OINTMENT | CUTANEOUS | Status: AC
  Filled 2011-12-26: qty 22

## 2011-12-26 MED ORDER — CEFAZOLIN SODIUM-DEXTROSE 2-3 GM-% IV SOLR: 2.0000 g | INTRAVENOUS | Status: DC

## 2011-12-26 MED ORDER — MIDAZOLAM HCL 2 MG/2ML IJ SOLN
1.0000 mg | INTRAMUSCULAR | Status: DC | PRN
  Administered 2011-12-26 (×2): 1 mg via INTRAVENOUS

## 2011-12-26 MED ORDER — CEFAZOLIN SODIUM-DEXTROSE 2-3 GM-% IV SOLR
INTRAVENOUS | Status: AC
  Filled 2011-12-26: qty 50

## 2011-12-26 MED ORDER — PROPOFOL 10 MG/ML IV EMUL
INTRAVENOUS | Status: AC
  Filled 2011-12-26: qty 20

## 2011-12-26 MED ORDER — CEFAZOLIN SODIUM-DEXTROSE 2-3 GM-% IV SOLR
INTRAVENOUS | Status: DC | PRN
  Administered 2011-12-26: 2 g via INTRAVENOUS

## 2011-12-26 MED ORDER — HEPARIN SODIUM (PORCINE) 1000 UNIT/ML IJ SOLN
INTRAMUSCULAR | Status: DC | PRN
  Administered 2011-12-26: 4000 [IU] via INTRAVENOUS

## 2011-12-26 MED ORDER — MUPIROCIN 2 % EX OINT
TOPICAL_OINTMENT | Freq: Once | CUTANEOUS | Status: AC
  Administered 2011-12-26: 1 via NASAL

## 2011-12-26 MED ORDER — FENTANYL CITRATE 0.05 MG/ML IJ SOLN
INTRAMUSCULAR | Status: AC
  Filled 2011-12-26: qty 2

## 2011-12-26 MED ORDER — FENTANYL CITRATE 0.05 MG/ML IJ SOLN
INTRAMUSCULAR | Status: DC | PRN
  Administered 2011-12-26 (×2): 25 ug via INTRAVENOUS

## 2011-12-26 SURGICAL SUPPLY — 39 items
APPLIER CLIP 9.375 SM OPEN (CLIP)
APR CLP SM 9.3 20 MLT OPN (CLIP)
BAG DECANTER FOR FLEXI CONT (MISCELLANEOUS) ×2 IMPLANT
BAG HAMPER (MISCELLANEOUS) ×2 IMPLANT
CLIP APPLIE 9.375 SM OPEN (CLIP) IMPLANT
CLOTH BEACON ORANGE TIMEOUT ST (SAFETY) ×2 IMPLANT
COVER LIGHT HANDLE STERIS (MISCELLANEOUS) ×4 IMPLANT
DECANTER SPIKE VIAL GLASS SM (MISCELLANEOUS) ×2 IMPLANT
DRAPE C-ARM FOLDED MOBILE STRL (DRAPES) ×2 IMPLANT
DRSG TEGADERM 2-3/8X2-3/4 SM (GAUZE/BANDAGES/DRESSINGS) ×2 IMPLANT
ELECT REM PT RETURN 9FT ADLT (ELECTROSURGICAL) ×2
ELECTRODE REM PT RTRN 9FT ADLT (ELECTROSURGICAL) ×1 IMPLANT
GLOVE SKINSENSE NS SZ7.0 (GLOVE) ×1
GLOVE SKINSENSE STRL SZ7.0 (GLOVE) ×1 IMPLANT
GOWN STRL REIN XL XLG (GOWN DISPOSABLE) ×4 IMPLANT
IV NS 500ML (IV SOLUTION) ×2
IV NS 500ML BAXH (IV SOLUTION) ×1 IMPLANT
KIT PORT POWER 8FR ISP MRI (CATHETERS) ×2 IMPLANT
KIT ROOM TURNOVER APOR (KITS) ×2 IMPLANT
MANIFOLD NEPTUNE II (INSTRUMENTS) ×2 IMPLANT
NEEDLE HYPO 18GX1.5 BLUNT FILL (NEEDLE) ×2 IMPLANT
NEEDLE HYPO 25X1 1.5 SAFETY (NEEDLE) ×2 IMPLANT
PACK MINOR (CUSTOM PROCEDURE TRAY) ×2 IMPLANT
PAD ARMBOARD 7.5X6 YLW CONV (MISCELLANEOUS) ×2 IMPLANT
SET BASIN LINEN APH (SET/KITS/TRAYS/PACK) ×2 IMPLANT
SHEATH COOK PEEL AWAY SET 8F (SHEATH) IMPLANT
SOL PREP PROV IODINE SCRUB 4OZ (MISCELLANEOUS) ×2 IMPLANT
SPONGE GAUZE 2X2 8PLY STRL LF (GAUZE/BANDAGES/DRESSINGS) ×2 IMPLANT
STRIP CLOSURE SKIN 1/2X4 (GAUZE/BANDAGES/DRESSINGS) ×2 IMPLANT
SUT VIC AB 4-0 SH 27 (SUTURE) ×2
SUT VIC AB 4-0 SH 27XBRD (SUTURE) ×1 IMPLANT
SUT VIC AB 5-0 P-3 18X BRD (SUTURE) ×1 IMPLANT
SUT VIC AB 5-0 P3 18 (SUTURE) ×2
SYR 20CC LL (SYRINGE) ×2 IMPLANT
SYR 5ML LL (SYRINGE) ×4 IMPLANT
SYR BULB IRRIGATION 50ML (SYRINGE) ×2 IMPLANT
SYR CONTROL 10ML LL (SYRINGE) ×2 IMPLANT
TOWEL OR 17X26 4PK STRL BLUE (TOWEL DISPOSABLE) ×2 IMPLANT
WATER STERILE IRR 1000ML POUR (IV SOLUTION) ×4 IMPLANT

## 2011-12-26 NOTE — Progress Notes (Signed)
Post OP Check  Wound clean and dry.  Pt. Doing well without any discomfort.  CXR shows no pneumothorax and tip of catheter in SVC.  Filed Vitals:   12/26/11 0845  BP: 100/58  Pulse: 72  Temp: 97.8 F (36.6 C)  Resp: 20  O2 sat 100%  Discharge and follow up arranged.

## 2011-12-26 NOTE — Progress Notes (Signed)
78 yr old w. Female with lung CA for placement of porta cath for chemo. Procedure and risks explained and informed consent obtained and labs reviewed. All questions answered and there are no acute changes since H&P.  Filed Vitals:    12/26/11 0600   BP:  127/76   Pulse:  78   Temp:    Resp:  20   98.7

## 2011-12-26 NOTE — Transfer of Care (Signed)
Immediate Anesthesia Transfer of Care Note  Patient: [name]  Procedure(s) Performed: Procedure(s) (LRB) with comments: INSERTION PORT-A-CATH (Left) - left subclavian  Patient Location: PACU  Anesthesia Type: MAC  Level of Consciousness: awake, alert , oriented and patient cooperative  Airway & Oxygen Therapy: Patient Spontanous Breathing and Patient connected to face mask oxygen  Post-op Assessment: Report given to PACU RN, Post -op Vital signs reviewed and stable and Patient moving all extremities  Post vital signs: Reviewed and stable  Complications: No apparent anesthesia complications

## 2011-12-26 NOTE — Anesthesia Preprocedure Evaluation (Signed)
Anesthesia Evaluation  Patient identified by MRN, date of birth, ID band Patient awake    Reviewed: Allergy & Precautions, H&P , NPO status , Patient's Chart, lab work & pertinent test results  Airway Mallampati: II      Dental  (+) Edentulous Upper and Edentulous Lower   Pulmonary COPD (Lung CA, cough) COPD inhaler,  breath sounds clear to auscultation        Cardiovascular - angina+ CAD, + Cardiac Stents and + Peripheral Vascular Disease Rhythm:Regular     Neuro/Psych    GI/Hepatic GERD-  Medicated and Controlled,  Endo/Other  Hypothyroidism   Renal/GU      Musculoskeletal   Abdominal   Peds  Hematology   Anesthesia Other Findings   Reproductive/Obstetrics                           Anesthesia Physical Anesthesia Plan  ASA: III  Anesthesia Plan: MAC   Post-op Pain Management:    Induction: Intravenous  Airway Management Planned: Nasal Cannula  Additional Equipment:   Intra-op Plan:   Post-operative Plan:   Informed Consent: I have reviewed the patients History and Physical, chart, labs and discussed the procedure including the risks, benefits and alternatives for the proposed anesthesia with the patient or authorized representative who has indicated his/her understanding and acceptance.     Plan Discussed with:   Anesthesia Plan Comments:         Anesthesia Quick Evaluation

## 2011-12-26 NOTE — Brief Op Note (Signed)
12/26/2011  8:53 AM  PATIENT:  Jessica Priest  78 y.o. female  PRE-OPERATIVE DIAGNOSIS:  lung cancer  POST-OPERATIVE DIAGNOSIS:  lung cancer  PROCEDURE:  Procedure(s) (LRB) with comments: INSERTION PORT-A-CATH (Left) - left subclavian  SURGEON:  Surgeon(s) and Role:    * Marlane Hatcher, MD - Primary  PHYSICIAN ASSISTANT:   ASSISTANTS: none   ANESTHESIA:   IV sedation  EBL:  Total I/O In: 150 [I.V.:150] Out: -   BLOOD ADMINISTERED:none  DRAINS: none   LOCAL MEDICATIONS USED:  XYLOCAINE ~8cc  SPECIMEN:  No Specimen  DISPOSITION OF SPECIMEN:  N/A  COUNTS:  YES  TOURNIQUET:  * No tourniquets in log *  DICTATION: .Other Dictation: Dictation Number OR Dict. # [id number].  PLAN OF CARE: Discharge to home after PACU  PATIENT DISPOSITION:  PACU - hemodynamically stable.   Delay start of Pharmacological VTE agent (>24hrs) due to surgical blood loss or risk of bleeding: not applicable

## 2011-12-26 NOTE — Anesthesia Postprocedure Evaluation (Signed)
  Anesthesia Post-op Note  Patient: [name]  Procedure(s) Performed: Procedure(s) (LRB) with comments: INSERTION PORT-A-CATH (Left) - left subclavian  Patient Location: PACU  Anesthesia Type: MAC  Level of Consciousness: awake, alert , oriented and patient cooperative  Airway and Oxygen Therapy: Patient Spontanous Breathing  Post-op Pain: none  Post-op Assessment: Post-op Vital signs reviewed, Patient's Cardiovascular Status Stable, Respiratory Function Stable, Patent Airway and Pain level controlled  Post-op Vital Signs: Reviewed and stable  Complications: No apparent anesthesia complications

## 2011-12-26 NOTE — Op Note (Signed)
[name], [name] NO.:  [id number]  MEDICAL RECORD NO.:  [id number]  LOCATION:  [facility]                          FACILITY:  [facility]  PHYSICIAN:  Barbaraann Barthel, M.D. DATE OF BIRTH:  1934-01-07  DATE OF PROCEDURE:  12/26/2011 DATE OF DISCHARGE:  12/26/2011                              OPERATIVE REPORT   PREOPERATIVE DIAGNOSIS:  Carcinoma of the lung.  POSTOPERATIVE DIAGNOSIS:  Carcinoma of the lung.  PROCEDURE:  Placement of left subclavian Port-A-Cath under fluoro.  SURGEON:  Barbaraann Barthel, MD  NOTE:  This is a 78 year old white female who was referred for Port-A- Cath placement for carcinoma of the lung.  She had prominent left sided nodules and mediastinal lymphadenopathy.  We discussed the procedure with her and her sons in details discussing complications, not limited to, but including bleeding, infection, thrombosis, pneumothorax and catheter embolization.  Informed consent was obtained.  GROSS OPERATIVE FINDINGS:  The patient had the catheter placed under fluoroscopy without any problems.  There were no complications.  WOUND CLASSIFICATION:  Clean.  SPECIMEN:  None.  TECHNIQUE:  The patient was placed in the supine position and after prepping the thorax with Betadine solution and draping in the usual manner, the patient was then placed in Trendelenburg and we cannulated the left subclavian vein with an 18-gauge needle passing a guidewire through this and later the vein dilator and the silastic catheter was threaded over the guidewire and placed into position under fluoroscopy. This was completely without incident.  We then connected the silastic catheter to the infusion device which we aspirated and then flushed with heparinized saline and buried in a subcutaneous pocket which we anesthetized with 1% Xylocaine without epinephrine.  After checking for hemostasis and flushing this, we then closed the wound after fluoroscopic confirmation of the  catheter's tips position.  We closed the subcutaneous layer with 4-0 Vicryl and the skin subcuticularly with 5-0 Vicryl with Betadine, impregnated Steri-Strips and a 2 x 2 dressing and an OpSite dressing.  No Neosporin was used as this patient is allergic to Neosporin.  The patient was taken to recovery room in satisfactory condition.  After the procedure, all sponge, needle, and instrument counts were found to be correct.  We will obtain a chest x-ray for confirmation of catheter tip position to rule out pneumothorax.  There were no complications clinically.     Barbaraann Barthel, M.D.     WB/MEDQ  D:  12/26/2011  T:  12/26/2011  Job:  [id number]  cc:   Ladona Horns. Mariel Sleet, MD Fax: [phone]

## 2011-12-27 ENCOUNTER — Other Ambulatory Visit (HOSPITAL_COMMUNITY): Payer: Self-pay | Admitting: Oncology

## 2011-12-28 ENCOUNTER — Encounter (HOSPITAL_BASED_OUTPATIENT_CLINIC_OR_DEPARTMENT_OTHER): Payer: Medicare Other

## 2011-12-28 ENCOUNTER — Other Ambulatory Visit (HOSPITAL_COMMUNITY): Payer: Self-pay | Admitting: Oncology

## 2011-12-28 ENCOUNTER — Encounter (HOSPITAL_COMMUNITY): Payer: Self-pay | Admitting: General Surgery

## 2011-12-28 VITALS — BP 135/67 | HR 61 | Temp 98.3°F | Resp 18 | Wt 111.0 lb

## 2011-12-28 DIAGNOSIS — C3490 Malignant neoplasm of unspecified part of unspecified bronchus or lung: Secondary | ICD-10-CM

## 2011-12-28 DIAGNOSIS — C3430 Malignant neoplasm of lower lobe, unspecified bronchus or lung: Secondary | ICD-10-CM

## 2011-12-28 DIAGNOSIS — C7949 Secondary malignant neoplasm of other parts of nervous system: Secondary | ICD-10-CM

## 2011-12-28 DIAGNOSIS — M818 Other osteoporosis without current pathological fracture: Secondary | ICD-10-CM

## 2011-12-28 DIAGNOSIS — Z5111 Encounter for antineoplastic chemotherapy: Secondary | ICD-10-CM

## 2011-12-28 LAB — CBC WITH DIFFERENTIAL/PLATELET
Hemoglobin: 12 g/dL (ref 12.0–15.0)
Lymphocytes Relative: 6 % — ABNORMAL LOW (ref 12–46)
Lymphs Abs: 0.3 10*3/uL — ABNORMAL LOW (ref 0.7–4.0)
Neutrophils Relative %: 91 % — ABNORMAL HIGH (ref 43–77)
Platelets: 215 10*3/uL (ref 150–400)
RBC: 4.22 MIL/uL (ref 3.87–5.11)
WBC: 4.4 10*3/uL (ref 4.0–10.5)

## 2011-12-28 LAB — COMPREHENSIVE METABOLIC PANEL
ALT: 6 U/L (ref 0–35)
Alkaline Phosphatase: 92 U/L (ref 39–117)
CO2: 25 mEq/L (ref 19–32)
Chloride: 100 mEq/L (ref 96–112)
GFR calc Af Amer: >90 mL/min (ref >90)
GFR calc non Af Amer: 81 mL/min — ABNORMAL LOW (ref >90)
Glucose, Bld: 203 mg/dL — ABNORMAL HIGH (ref 70–99)
Potassium: 3.8 mEq/L (ref 3.5–5.1)
Sodium: 135 mEq/L (ref 135–145)
Total Bilirubin: 0.3 mg/dL (ref 0.3–1.2)

## 2011-12-28 MED ORDER — SODIUM CHLORIDE 0.9 % IJ SOLN
10.0000 mL | INTRAMUSCULAR | Status: DC | PRN
  Filled 2011-12-28: qty 10

## 2011-12-28 MED ORDER — SODIUM CHLORIDE 0.9 % IV SOLN
Freq: Once | INTRAVENOUS | Status: AC
  Administered 2011-12-28: 09:00:00 via INTRAVENOUS

## 2011-12-28 MED ORDER — HEPARIN SOD (PORK) LOCK FLUSH 100 UNIT/ML IV SOLN
500.0000 [IU] | Freq: Once | INTRAVENOUS | Status: AC | PRN
  Administered 2011-12-28: 500 [IU]
  Filled 2011-12-28: qty 5

## 2011-12-28 MED ORDER — SODIUM CHLORIDE 0.9 % IV SOLN
363.0000 mg | Freq: Once | INTRAVENOUS | Status: AC
  Administered 2011-12-28: 360 mg via INTRAVENOUS
  Filled 2011-12-28: qty 36

## 2011-12-28 MED ORDER — SODIUM CHLORIDE 0.9 % IV SOLN
Freq: Once | INTRAVENOUS | Status: AC
  Administered 2011-12-28: 16 mg via INTRAVENOUS
  Filled 2011-12-28: qty 8

## 2011-12-28 MED ORDER — HEPARIN SOD (PORK) LOCK FLUSH 100 UNIT/ML IV SOLN
INTRAVENOUS | Status: AC
  Filled 2011-12-28: qty 5

## 2011-12-28 MED ORDER — DEXAMETHASONE SODIUM PHOSPHATE 10 MG/ML IJ SOLN: 20.0000 mg | Freq: Once | INTRAMUSCULAR | Status: DC

## 2011-12-28 MED ORDER — DIPHENHYDRAMINE HCL 50 MG/ML IJ SOLN
INTRAMUSCULAR | Status: AC
  Filled 2011-12-28: qty 1

## 2011-12-28 MED ORDER — DIPHENHYDRAMINE HCL 50 MG/ML IJ SOLN
50.0000 mg | Freq: Once | INTRAMUSCULAR | Status: AC
  Administered 2011-12-28: 50 mg via INTRAVENOUS

## 2011-12-28 MED ORDER — FAMOTIDINE IN NACL 20-0.9 MG/50ML-% IV SOLN
20.0000 mg | Freq: Once | INTRAVENOUS | Status: AC
  Administered 2011-12-28: 20 mg via INTRAVENOUS

## 2011-12-28 MED ORDER — ZOLEDRONIC ACID 4 MG/5ML IV CONC
4.0000 mg | Freq: Once | INTRAVENOUS | Status: AC
  Administered 2011-12-28: 4 mg via INTRAVENOUS

## 2011-12-28 MED ORDER — SODIUM CHLORIDE 0.9 % IV SOLN: 16.0000 mg | Freq: Once | INTRAVENOUS | Status: DC

## 2011-12-28 MED ORDER — FAMOTIDINE IN NACL 20-0.9 MG/50ML-% IV SOLN
INTRAVENOUS | Status: AC
  Filled 2011-12-28: qty 50

## 2011-12-28 MED ORDER — PACLITAXEL CHEMO INJECTION 300 MG/50ML
175.0000 mg/m2 | Freq: Once | INTRAVENOUS | Status: AC
  Administered 2011-12-28: 264 mg via INTRAVENOUS
  Filled 2011-12-28: qty 44

## 2011-12-28 MED ORDER — ZOLEDRONIC ACID 4 MG/5ML IV CONC
4.0000 mg | Freq: Once | INTRAVENOUS | Status: DC
  Filled 2011-12-28: qty 5

## 2011-12-29 ENCOUNTER — Encounter (HOSPITAL_BASED_OUTPATIENT_CLINIC_OR_DEPARTMENT_OTHER): Payer: Medicare Other

## 2011-12-29 DIAGNOSIS — C3490 Malignant neoplasm of unspecified part of unspecified bronchus or lung: Secondary | ICD-10-CM

## 2011-12-29 DIAGNOSIS — C7931 Secondary malignant neoplasm of brain: Secondary | ICD-10-CM

## 2011-12-29 DIAGNOSIS — C3430 Malignant neoplasm of lower lobe, unspecified bronchus or lung: Secondary | ICD-10-CM

## 2011-12-29 MED ORDER — PEGFILGRASTIM INJECTION 6 MG/0.6ML
6.0000 mg | Freq: Once | SUBCUTANEOUS | Status: AC
  Administered 2011-12-29: 6 mg via SUBCUTANEOUS

## 2011-12-29 MED ORDER — PEGFILGRASTIM INJECTION 6 MG/0.6ML
SUBCUTANEOUS | Status: AC
  Filled 2011-12-29: qty 0.6

## 2011-12-29 NOTE — Progress Notes (Signed)
GABRIEL PAULDING presents today for injection per the provider's orders.  Neulasta administered administration without incident; see MAR for injection details.  Patient tolerated procedure well and without incident.  No questions or complaints noted at this time.

## 2012-01-01 ENCOUNTER — Other Ambulatory Visit (HOSPITAL_COMMUNITY): Payer: Self-pay | Admitting: Oncology

## 2012-01-01 ENCOUNTER — Encounter (HOSPITAL_COMMUNITY): Payer: Medicare Other

## 2012-01-01 ENCOUNTER — Encounter (HOSPITAL_BASED_OUTPATIENT_CLINIC_OR_DEPARTMENT_OTHER): Payer: Medicare Other

## 2012-01-01 VITALS — BP 106/65 | HR 94 | Temp 99.0°F | Resp 18

## 2012-01-01 DIAGNOSIS — C7931 Secondary malignant neoplasm of brain: Secondary | ICD-10-CM

## 2012-01-01 DIAGNOSIS — C3490 Malignant neoplasm of unspecified part of unspecified bronchus or lung: Secondary | ICD-10-CM

## 2012-01-01 DIAGNOSIS — I959 Hypotension, unspecified: Secondary | ICD-10-CM

## 2012-01-01 DIAGNOSIS — C3430 Malignant neoplasm of lower lobe, unspecified bronchus or lung: Secondary | ICD-10-CM

## 2012-01-01 LAB — CBC
MCH: 29.4 pg (ref 26.0–34.0)
MCHC: 34.1 g/dL (ref 30.0–36.0)
MCV: 86.2 fL (ref 78.0–100.0)
Platelets: 150 10*3/uL (ref 150–400)
RDW: 13.5 % (ref 11.5–15.5)

## 2012-01-01 LAB — DIFFERENTIAL
Basophils Absolute: 0 10*3/uL (ref 0.0–0.1)
Eosinophils Absolute: 0.1 10*3/uL (ref 0.0–0.7)
Eosinophils Relative: 1 % (ref 0–5)
Monocytes Absolute: 0.1 10*3/uL (ref 0.1–1.0)

## 2012-01-01 LAB — BASIC METABOLIC PANEL
BUN: 14 mg/dL (ref 6–23)
CO2: 25 mEq/L (ref 19–32)
Calcium: 8.8 mg/dL (ref 8.4–10.5)
Creatinine, Ser: 0.7 mg/dL (ref 0.50–1.10)
Glucose, Bld: 94 mg/dL (ref 70–99)

## 2012-01-01 MED ORDER — SODIUM CHLORIDE 0.9 % IV SOLN
INTRAVENOUS | Status: DC
  Administered 2012-01-01: 11:00:00 via INTRAVENOUS

## 2012-01-01 MED ORDER — HEPARIN SOD (PORK) LOCK FLUSH 100 UNIT/ML IV SOLN
500.0000 [IU] | Freq: Once | INTRAVENOUS | Status: AC
  Administered 2012-01-01: 500 [IU] via INTRAVENOUS
  Filled 2012-01-01: qty 5

## 2012-01-01 MED ORDER — SODIUM CHLORIDE 0.9 % IJ SOLN
10.0000 mL | INTRAMUSCULAR | Status: DC | PRN
  Administered 2012-01-01: 10 mL via INTRAVENOUS
  Filled 2012-01-01: qty 10

## 2012-01-01 MED ORDER — HEPARIN SOD (PORK) LOCK FLUSH 100 UNIT/ML IV SOLN
INTRAVENOUS | Status: AC
  Filled 2012-01-01: qty 5

## 2012-01-01 NOTE — Progress Notes (Signed)
Note from after-chemo wellness check:  Is in clinic today receiving iv fluids.  Has not felt well at all after chemo tx. Dr. Mariel Sleet aware.  Tolerated IV flds well.Home accompanied by family members.

## 2012-01-01 NOTE — Progress Notes (Signed)
Dr. Mariel Sleet went in to see patient today to review labs with patient and made changes to patient's dexamethasone. Patient's family verbalized understanding of instructions.

## 2012-01-01 NOTE — Patient Instructions (Addendum)
[facility]  Discharge Instructions [name]  DOB Sep 23, 1933 CSN [id number]  MRN [id number] Dr. Glenford Peers   RECOMMENDATIONS MADE BY THE CONSULTANT AND ANY TEST RESULTS WILL BE SENT TO YOUR REFERRING DOCTOR.   EXAM FINDINGS BY MD TODAY AND SIGNS AND SYMPTOMS TO REPORT TO CLINIC OR PRIMARY MD:   Dexamethasone 4mg  tablet. Take 2 tablets @ 9pm the night before chemo and take 2 tablets @ 3am the morning of chemo. Then starting the day after chemo, take 2 tablets in the am and 2 tablets in the pm for 2 days. Then take 1/2 tablet x 2 days.  You may cut the dexamethasone tablet in half per pharmacist at [facility].   If the blood pressure is still low in the am, come in for more IV fluids. Call ahead and let us know what the blood pressure is.   Take Aleve for the bone pain as the bottle directs. Take with food. You may apply a heating pad where needed for pain relief.     I acknowledge that I have been informed and understand all the instructions given to me and received a copy. I do not have any more questions at this time, but understand that I may call the Specialty Clinic at [facility] at [phone] during business hours should I have any further questions or need assistance in obtaining follow-up care.    __________________________________________  _____________  __________ Signature of Patient or Authorized Representative            Date                   Time    __________________________________________ Nurse's Signature

## 2012-01-03 ENCOUNTER — Encounter: Payer: Self-pay | Admitting: Dietician

## 2012-01-03 NOTE — Progress Notes (Signed)
Brief Out-patient Oncology Nutrition Note  Reason: Patient Screened Positive For Nutrition Risk For Unintentional Weight Loss and Decreased Appetite.   [name] is a 78 year old female patient of Dr. Basilio Cairo, diagnosed with Lung cancer. Contacted patient via telephone for positive nutrition risk. She reported her appetite and intake are very poor due to pain/ burning in her chest. She reported any time she tries to eat or drink anything her chest burns too badly. I have encouraged her to call her doctor about this issue right away. I have encouraged the patient to start drinking Ensure nutrition supplement daily.   Wt Readings from Last 10 Encounters:  12/28/11 111 lb (50.349 kg)  12/18/11 112 lb 6.4 oz (50.984 kg)  12/11/11 110 lb 8 oz (50.122 kg)  11/27/11 110 lb 11.2 oz (50.213 kg)  10/30/11 113 lb 5.1 oz (51.4 kg)    RD available for nutrition needs.   Jennifer Finn, MS, RD, LDN [phone]

## 2012-01-04 ENCOUNTER — Encounter (HOSPITAL_COMMUNITY): Payer: Self-pay | Admitting: *Deleted

## 2012-01-04 ENCOUNTER — Telehealth (HOSPITAL_COMMUNITY): Payer: Self-pay | Admitting: *Deleted

## 2012-01-04 NOTE — Progress Notes (Signed)
[employer] Magic Mouthwash 240 ml Refill x 1 called to [facility] per T.Kefalas,PA 1 tsp swish and swallow 4 x daily.

## 2012-01-05 LAB — PTH-RELATED PEPTIDE: PTH-related peptide: 16 pg/mL (ref 14–27)

## 2012-01-08 ENCOUNTER — Encounter (HOSPITAL_BASED_OUTPATIENT_CLINIC_OR_DEPARTMENT_OTHER): Payer: Medicare Other | Admitting: Oncology

## 2012-01-08 VITALS — BP 108/67 | HR 73 | Temp 97.3°F | Resp 22 | Wt 106.4 lb

## 2012-01-08 DIAGNOSIS — R5383 Other fatigue: Secondary | ICD-10-CM

## 2012-01-08 DIAGNOSIS — K259 Gastric ulcer, unspecified as acute or chronic, without hemorrhage or perforation: Secondary | ICD-10-CM

## 2012-01-08 DIAGNOSIS — R1013 Epigastric pain: Secondary | ICD-10-CM

## 2012-01-08 DIAGNOSIS — R5381 Other malaise: Secondary | ICD-10-CM

## 2012-01-08 DIAGNOSIS — K219 Gastro-esophageal reflux disease without esophagitis: Secondary | ICD-10-CM

## 2012-01-08 DIAGNOSIS — R109 Unspecified abdominal pain: Secondary | ICD-10-CM

## 2012-01-08 MED ORDER — SUCRALFATE 1 GM/10ML PO SUSP: 1.0000 g | Freq: Four times a day (QID) | ORAL | Status: DC

## 2012-01-08 NOTE — Progress Notes (Signed)
[name] is seen as a work-in today.  She is S/P her first cycle of chemotherapy on 12/28/2011.  On 01/02/2012 she began to complain of burning in her chest.  She reports that it is brining and knife like.  It begins in the AM with consumption of any food or liquid.  She reports that she is taking her Omeprazole BID as ordered.  She admits that she has a history of gastric ulcers with her past history of breast cancer.  She is taking Dexamethasone with her chemotherapy.  She localizes the pain to the epigastric region.  Secondary to this pain, she is not consuming enough liquids and food.  She has lost about 6 lbs.  She denies any chest pain and SOB.  She denies any heart palpitations.   BP 108/67  Pulse 73  Temp 97.3 F (36.3 C) (Oral)  Resp 22  Wt 106 lb 6.4 oz (48.263 kg) General: In the exam room laying head on bedside stand with a pillow.  Accompanied by her son. HEENT: No oral candidiasis noted.  Neck: Trachea midline Cardiac: RRR Lungs: CTA with decreased breath sounds throughout. Abdomen: Tenderness on palpation of epigastric region. Skin: Warm and dry Neuro: No focal deficits noted.   Assessment: 1. Gastric ulcers 2. Epigastric abdominal pain 3. Weight loss 4. Fatigued and tired. 5. History of gastric ulcers  Plan: 1. There is no indications of cardiac issues.  2. Continue with omeprazole BID as ordered 3. Will add Carafate 1 g four times daily.  This medication was e-scribed. 4. Patient will call us on Wednesday to let us know if she is better.  If not, will perform EKG and if negative, will refer to GI. 5. Take [employer] Magic Mouthwash as needed.  6. Return as scheduled for follow-up.  All questions were answered.  Patient and plan discussed with Dr. Mariel Sleet and he is in agreement with aforementioned.   KEFALAS,THOMAS

## 2012-01-08 NOTE — Progress Notes (Signed)
Patient is a walk in today for sharp mid anterior chest pain with eating or drinking.  Weak and weight loss of 5 lbs

## 2012-01-08 NOTE — Patient Instructions (Addendum)
[facility]  Discharge Instructions  RECOMMENDATIONS MADE BY THE CONSULTANT AND ANY TEST RESULTS WILL BE SENT TO YOUR REFERRING DOCTOR.   EXAM FINDINGS BY MD TODAY AND SIGNS AND SYMPTOMS TO REPORT TO CLINIC OR PRIMARY MD: Exam per T. Jacalyn Lefevre  MEDICATIONS PRESCRIBED:  Carafate suspension 4 times a day. You can stop Dukes mouthwash for now. Start back if you have sores, tenderness, white patches.  INSTRUCTIONS GIVEN AND DISCUSSED: Call us Wednesday.     I acknowledge that I have been informed and understand all the instructions given to me and received a copy. I do not have any more questions at this time, but understand that I may call the Specialty Clinic at [facility] at [phone] during business hours should I have any further questions or need assistance in obtaining follow-up care.    __________________________________________  _____________  __________ Signature of Patient or Authorized Representative            Date                   Time    __________________________________________ Nurse's Signature

## 2012-01-16 ENCOUNTER — Ambulatory Visit: Payer: Medicare Other | Admitting: Radiation Oncology

## 2012-01-18 ENCOUNTER — Encounter (HOSPITAL_COMMUNITY): Payer: Medicare Other | Attending: Oncology | Admitting: Oncology

## 2012-01-18 ENCOUNTER — Encounter (HOSPITAL_BASED_OUTPATIENT_CLINIC_OR_DEPARTMENT_OTHER): Payer: Medicare Other

## 2012-01-18 ENCOUNTER — Encounter (HOSPITAL_COMMUNITY): Payer: Self-pay | Admitting: Oncology

## 2012-01-18 VITALS — BP 101/51 | HR 76 | Temp 97.7°F | Resp 20 | Wt 110.8 lb

## 2012-01-18 DIAGNOSIS — C7949 Secondary malignant neoplasm of other parts of nervous system: Secondary | ICD-10-CM

## 2012-01-18 DIAGNOSIS — C3490 Malignant neoplasm of unspecified part of unspecified bronchus or lung: Secondary | ICD-10-CM

## 2012-01-18 DIAGNOSIS — C7931 Secondary malignant neoplasm of brain: Secondary | ICD-10-CM | POA: Insufficient documentation

## 2012-01-18 DIAGNOSIS — Z5111 Encounter for antineoplastic chemotherapy: Secondary | ICD-10-CM

## 2012-01-18 DIAGNOSIS — J449 Chronic obstructive pulmonary disease, unspecified: Secondary | ICD-10-CM

## 2012-01-18 DIAGNOSIS — C3430 Malignant neoplasm of lower lobe, unspecified bronchus or lung: Secondary | ICD-10-CM

## 2012-01-18 LAB — CBC WITH DIFFERENTIAL/PLATELET
Basophils Absolute: 0 10*3/uL (ref 0.0–0.1)
Basophils Relative: 0 % (ref 0–1)
Eosinophils Absolute: 0 10*3/uL (ref 0.0–0.7)
MCH: 28.6 pg (ref 26.0–34.0)
MCHC: 32.6 g/dL (ref 30.0–36.0)
Monocytes Absolute: 0.2 10*3/uL (ref 0.1–1.0)
Neutro Abs: 7.4 10*3/uL (ref 1.7–7.7)
Neutrophils Relative %: 94 % — ABNORMAL HIGH (ref 43–77)
RDW: 14.5 % (ref 11.5–15.5)

## 2012-01-18 LAB — COMPREHENSIVE METABOLIC PANEL
AST: 17 U/L (ref 0–37)
Albumin: 3.5 g/dL (ref 3.5–5.2)
Chloride: 102 mEq/L (ref 96–112)
Creatinine, Ser: 0.79 mg/dL (ref 0.50–1.10)
Potassium: 3.9 mEq/L (ref 3.5–5.1)
Total Bilirubin: 0.3 mg/dL (ref 0.3–1.2)
Total Protein: 6.6 g/dL (ref 6.0–8.3)

## 2012-01-18 MED ORDER — SODIUM CHLORIDE 0.9 % IJ SOLN
INTRAMUSCULAR | Status: AC
  Filled 2012-01-18: qty 10

## 2012-01-18 MED ORDER — DEXAMETHASONE SODIUM PHOSPHATE 10 MG/ML IJ SOLN: 20.0000 mg | Freq: Once | INTRAMUSCULAR | Status: DC

## 2012-01-18 MED ORDER — SODIUM CHLORIDE 0.9 % IV SOLN: 16.0000 mg | Freq: Once | INTRAVENOUS | Status: DC

## 2012-01-18 MED ORDER — HEPARIN SOD (PORK) LOCK FLUSH 100 UNIT/ML IV SOLN
INTRAVENOUS | Status: AC
  Filled 2012-01-18: qty 5

## 2012-01-18 MED ORDER — FAMOTIDINE IN NACL 20-0.9 MG/50ML-% IV SOLN
INTRAVENOUS | Status: AC
  Filled 2012-01-18: qty 50

## 2012-01-18 MED ORDER — DIPHENHYDRAMINE HCL 50 MG/ML IJ SOLN
50.0000 mg | Freq: Once | INTRAMUSCULAR | Status: AC
  Administered 2012-01-18: 50 mg via INTRAVENOUS

## 2012-01-18 MED ORDER — FAMOTIDINE IN NACL 20-0.9 MG/50ML-% IV SOLN
20.0000 mg | Freq: Once | INTRAVENOUS | Status: AC
  Administered 2012-01-18: 20 mg via INTRAVENOUS

## 2012-01-18 MED ORDER — LORAZEPAM 2 MG/ML IJ SOLN
INTRAMUSCULAR | Status: AC
  Filled 2012-01-18: qty 1

## 2012-01-18 MED ORDER — SODIUM CHLORIDE 0.9 % IV SOLN
Freq: Once | INTRAVENOUS | Status: AC
  Administered 2012-01-18: 16 mg via INTRAVENOUS
  Filled 2012-01-18: qty 8

## 2012-01-18 MED ORDER — LORAZEPAM 2 MG/ML IJ SOLN
0.5000 mg | Freq: Once | INTRAMUSCULAR | Status: AC
  Administered 2012-01-18: 0.5 mg via INTRAVENOUS

## 2012-01-18 MED ORDER — SODIUM CHLORIDE 0.9 % IV SOLN
Freq: Once | INTRAVENOUS | Status: AC
  Administered 2012-01-18: 09:00:00 via INTRAVENOUS

## 2012-01-18 MED ORDER — SODIUM CHLORIDE 0.9 % IJ SOLN
10.0000 mL | INTRAMUSCULAR | Status: DC | PRN
  Administered 2012-01-18: 10 mL
  Filled 2012-01-18: qty 10

## 2012-01-18 MED ORDER — HEPARIN SOD (PORK) LOCK FLUSH 100 UNIT/ML IV SOLN
500.0000 [IU] | Freq: Once | INTRAVENOUS | Status: AC | PRN
  Administered 2012-01-18: 500 [IU]
  Filled 2012-01-18: qty 5

## 2012-01-18 MED ORDER — PACLITAXEL CHEMO INJECTION 300 MG/50ML
175.0000 mg/m2 | Freq: Once | INTRAVENOUS | Status: AC
  Administered 2012-01-18: 264 mg via INTRAVENOUS
  Filled 2012-01-18: qty 44

## 2012-01-18 MED ORDER — SODIUM CHLORIDE 0.9 % IV SOLN
363.0000 mg | Freq: Once | INTRAVENOUS | Status: AC
  Administered 2012-01-18: 360 mg via INTRAVENOUS
  Filled 2012-01-18: qty 36

## 2012-01-18 MED ORDER — DIPHENHYDRAMINE HCL 50 MG/ML IJ SOLN
INTRAMUSCULAR | Status: AC
  Filled 2012-01-18: qty 1

## 2012-01-18 NOTE — Progress Notes (Signed)
Tolerated well

## 2012-01-18 NOTE — Progress Notes (Signed)
Colette Ribas, MD [address] [place] [address]  1. Squamous cell carcinoma lung  NM PET Image Restag (PS) Skull Base To Thigh  2. Single Brain Metastasis  NM PET Image Restag (PS) Skull Base To Thigh    CURRENT THERAPY: S/P cycle 1 of Carbo/Taxol (12/28/2011) with neulasta support.  INTERVAL HISTORY: [name] 78 y.o. 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  S/P stereotactic radiosurgery on 12/15/2011. S/P palliative radiation to lung lesion(s). Now undergoing systemic chemotherapy.  That he tolerated her first cycle of chemotherapy well. She did have one issue with dehydration and nausea. She came to clinic to receive some fluids and anti-emetics. Contributing to this was some epigastric pain for which she came to clinic as a walk-in for. The patient was seen by myself and I suspected that she had gastric ulcer disease exacerbated by chemotherapy and dexamethasone. I therefore, increase or PPI to twice a day dosing in addition to adding Carafate 4 times daily. She reports since starting the Carafate she has done much better. She reports her appetite is decent and she is consuming plenty of fluids.  That he reports to me that she had one day of lower tremor the discomfort today following the last injection. She reports it was not debilitating and was well-controlled with pain medication and she has at home. It result was an approximately one day later.  That he is prepared to undergo cycle 2 of chemotherapy as scheduled today. I will see her back before cycle 3 of chemotherapy. Following chemotherapy cycle #3, we'll perform restaging scans, namely a PET scan for restaging purposes she did have some activity in her vocal cords that were appreciated. He was off his activity was physiologic but was uncanny that her hypermetabolic activity the vocal cords was  asymmetric.  Otherwise, but he denies any complaints. Complete ROS questioning is negative. She did have one bout of constipation which was resolved with magnesium citrate. She is presently on a regimen to help with her bowels to prevent constipation the future.   Past Medical History  Diagnosis Date  . COPD (chronic obstructive pulmonary disease)   . Cancer     breast  . Peripheral vascular disease   . Hilar mass     left lung  . Breast cancer   . Squamous cell carcinoma lung     breast   . Hypothyroidism     has COPD (chronic obstructive pulmonary disease); Abdominal pain, acute; Collapsed lung; Squamous cell carcinoma lung; and Single Brain Metastasis on her problem list.     is allergic to sulfa antibiotics; neosporin; aspirin; and codeine.  [name] does not currently have medications on file.  Past Surgical History  Procedure Date  . Mastectomy     rt side  . Abdominal hysterectomy   . Flexible bronchoscopy 11/09/2011    Procedure: FLEXIBLE BRONCHOSCOPY;  Surgeon: Fredirick Maudlin, MD;  Location: [facility];  Service: Pulmonary;  Laterality: Left;  . Thyroidectomy   . External ear surgery   . Neck mass excision     1960's  . Femoral artery stent 2009  . Portacath placement 12/26/2011    Procedure: INSERTION PORT-A-CATH;  Surgeon: Marlane Hatcher, MD;  Location: [facility];  Service: General;  Laterality: Left;  left subclavian    Denies any headaches, dizziness, double vision, fevers, chills, night sweats, nausea,  vomiting, diarrhea, constipation, chest pain, heart palpitations, shortness of breath, blood in stool, black tarry stool, urinary pain, urinary burning, urinary frequency, hematuria.   PHYSICAL EXAMINATION  ECOG PERFORMANCE STATUS: 1 - Symptomatic but completely ambulatory  There were no vitals filed for this visit.  GENERAL:alert, no distress, well nourished, well developed, comfortable, cooperative and smiling, talkative, seen in a chemotherapy room  preparing for cycle 2 SKIN: skin color, texture, turgor are normal, no rashes or significant lesions HEAD: Normocephalic, No masses, lesions, tenderness or abnormalities EYES: normal, Conjunctiva are pink and non-injected EARS: External ears normal OROPHARYNX:lips, buccal mucosa, and tongue normal and mucous membranes are moist  NECK: supple, trachea midline LYMPH:  not examined BREAST:not examined LUNGS: clear to auscultation  HEART: regular rate & rhythm, no murmurs, no gallops, S1 normal and S2 normal ABDOMEN:abdomen soft, non-tender and normal bowel sounds BACK: Back symmetric, no curvature. EXTREMITIES:less then 2 second capillary refill, no joint deformities, effusion, or inflammation, no edema, no skin discoloration, no clubbing, no cyanosis  NEURO: alert & oriented x 3 with fluent speech, no focal motor/sensory deficits   LABORATORY DATA: CBC    Component Value Date/Time   WBC 7.9 01/18/2012 0848   RBC 3.77* 01/18/2012 0848   HGB 10.8* 01/18/2012 0848   HCT 33.1* 01/18/2012 0848   PLT 174 01/18/2012 0848   MCV 87.8 01/18/2012 0848   MCH 28.6 01/18/2012 0848   MCHC 32.6 01/18/2012 0848   RDW 14.5 01/18/2012 0848   LYMPHSABS 0.3* 01/18/2012 0848   MONOABS 0.2 01/18/2012 0848   EOSABS 0.0 01/18/2012 0848   BASOSABS 0.0 01/18/2012 0848      Chemistry      Component Value Date/Time   NA 136 01/18/2012 0848   K 3.9 01/18/2012 0848   CL 102 01/18/2012 0848   CO2 23 01/18/2012 0848   BUN 20 01/18/2012 0848   CREATININE 0.79 01/18/2012 0848      Component Value Date/Time   CALCIUM 10.5 01/18/2012 0848   ALKPHOS 79 01/18/2012 0848   AST 17 01/18/2012 0848   ALT 14 01/18/2012 0848   BILITOT 0.3 01/18/2012 0848       PENDING LABS: CBC diff, CMET   PATHOLOGY: 11/09/2011  Diagnosis  Lung, biopsy, left lower lobe  - SMALL FRAGMENT OF NECROTIC TISSUE.  - REACTIVE BRONCHIAL EPITHELIUM WITH UNDERLYING STROMA FIBROSIS AND HEMORRHAGE.  Microscopic Comment  No definitive evidence of  atypia or malignancy identified in this material. Please correlate with concurrent  cytology reports [id number] and 129. (HCL:caf 11/10/11)  Abigail Miyamoto MD  Pathologist, Electronic Signature  (Case signed 11/10/2011)  Adequacy Reason  Satisfactory For Evaluation.  Diagnosis  BRONCHIAL BRUSHING, LEFT LOWER LOBE:  MALIGNANT CELLS CONSISTENT WITH SQUAMOUS CELL CARCINOMA.  Abigail Miyamoto MD  Pathologist, Electronic Signature  (Case signed 11/10/2011)    ASSESSMENT:  1. Stage IV metastatic squamous cell carcinoma to brain. S/P stereotactic radiosurgery on 12/15/2011. No undergoing palliative radiation therapy.  2. H/O Breast cancer in the past requiring mastectomy and chemotherapy.  3. COPD  4. Decreased appetite without weight loss, no intervention needed at this time. 5. Gastric ulcers with epigastric pain, started on Carafate with resolution of symptoms.     PLAN:  1. I personally reviewed and went over laboratory results with the patient. 2. Cycle 2 of chemotherapy as scheduled.  3. Following cycle 3 of chemotherapy, will restage with PET scan.  This is preferred due to vocal cord activity on initial staging PET scan.  4. PET scan order placed before beginning-middle of December following cycle 3 of chemotherapy, but before cycle 4.  5. Continue with Carafate has prescribed. When she starts getting alone her prescription they will contact the clinic and we will provide her with refills. 6. Patient occurs to continue with fluid consumption. 7. Patient return to the clinic in 3 weeks for followup.   All questions were answered. The patient knows to call the clinic with any problems, questions or concerns. We can certainly see the patient much sooner if necessary.  The patient and plan discussed with Glenford Peers, MD and he is in agreement with the aforementioned.  Kayen Grabel

## 2012-01-19 ENCOUNTER — Encounter: Payer: Self-pay | Admitting: Radiation Oncology

## 2012-01-19 ENCOUNTER — Encounter (HOSPITAL_BASED_OUTPATIENT_CLINIC_OR_DEPARTMENT_OTHER): Payer: Medicare Other

## 2012-01-19 ENCOUNTER — Ambulatory Visit
Admission: RE | Admit: 2012-01-19 | Discharge: 2012-01-19 | Disposition: A | Discharge status: Home / Self Care | Payer: Medicare Other | Source: Ambulatory Visit | Attending: Radiation Oncology | Admitting: Radiation Oncology

## 2012-01-19 VITALS — BP 111/68 | HR 83 | Temp 98.2°F | Resp 18

## 2012-01-19 VITALS — BP 124/50 | HR 69 | Temp 98.6°F | Wt 114.4 lb

## 2012-01-19 DIAGNOSIS — C3430 Malignant neoplasm of lower lobe, unspecified bronchus or lung: Secondary | ICD-10-CM

## 2012-01-19 DIAGNOSIS — C3490 Malignant neoplasm of unspecified part of unspecified bronchus or lung: Secondary | ICD-10-CM

## 2012-01-19 DIAGNOSIS — C7949 Secondary malignant neoplasm of other parts of nervous system: Secondary | ICD-10-CM

## 2012-01-19 DIAGNOSIS — C7931 Secondary malignant neoplasm of brain: Secondary | ICD-10-CM

## 2012-01-19 HISTORY — DX: Personal history of irradiation: Z92.3

## 2012-01-19 MED ORDER — PEGFILGRASTIM INJECTION 6 MG/0.6ML
6.0000 mg | Freq: Once | SUBCUTANEOUS | Status: AC
  Administered 2012-01-19: 6 mg via SUBCUTANEOUS

## 2012-01-19 MED ORDER — PEGFILGRASTIM INJECTION 6 MG/0.6ML
SUBCUTANEOUS | Status: AC
  Filled 2012-01-19: qty 0.6

## 2012-01-19 NOTE — Progress Notes (Addendum)
FU appt. today.  Chemotherapy yesterday. Reports that chemotherapy has caused her to have thrush and abdominal pain and indigestion.  Now taking Omeprazole BID with relief.  Complete alopecia noted today. Mouth clear with no signs of thrush.  Gait slight unsteady today.  Denies any headaches but notes last pm that she had floaters last pm.  "Good Appetite."  No nausea and vomiting.  Family reports that she had a brief episode where she could not see out of left eye on the 5th of October, the day following her [facility] treatment .

## 2012-01-19 NOTE — Progress Notes (Signed)
[name] presents today for injection per MD orders. Neulasta 6mg  administered SQ in right Abdomen.  Tolerated well. Administration without incident. Patient tolerated well.

## 2012-01-19 NOTE — Progress Notes (Signed)
Radiation Oncology         [phone] ________________________________  Name: [name] MRN: [id number]  Date: 01/19/2012  DOB: [birth year]  Follow-Up Visit Note  CC: Colette Ribas, MD  Assunta Found, MD  Diagnosis:   Brain metastasis - left parietal lobe, lung squamous cell carcinoma primary   Interval Since Last Radiation:  She completed stereotactic radiosurgery to the left parietal lobe lesion, 20 Gray in 1 fraction, on 12/15/2011  Narrative:  The patient returns today for routine follow-up.  She is receiving carbo/taxol    - second cycle given yesterday. She had some issues with stomach ulcers and thrush felt to be exacerbated by chemotherapy and dexamethasone. She responded well to omeprazole and Carafate. Her energy is good. Her hoarseness has evolved into a high pitched voice. She was seen by otolaryngology and parenthetically the endoscopy demonstrated unilateral vocal cord paralysis. 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    She is in good spirits. She has excellent insight and acceptance regarding her overall prognosis. She is with family today and they're all very supportive. She denies any new headaches nausea dizziness or neurologic symptoms. She had transient loss of vision in her left eye the day after receiving stereotactic radiosurgery. This is resolved.  She is only taking dexamethasone as premedication for her chemotherapy   ALLERGIES:  is allergic to sulfa antibiotics; neosporin; aspirin; and codeine.  Meds: Current Outpatient Prescriptions  Medication Sig Dispense Refill  . albuterol (PROVENTIL) (2.5 MG/3ML) 0.083% nebulizer solution Take 2.5 mg by nebulization every 4 (four) hours as needed. Shortness of Breath      . budesonide-formoterol (SYMBICORT) 160-4.5 MCG/ACT inhaler Inhale 2 puffs into the lungs 2 (two) times daily.      Marland Kitchen CARBOPLATIN IV Inject into the vein every 21 ( twenty-one) days.       . Cholecalciferol (VITAMIN D PO) Take 2,000 Units by mouth daily.       . clopidogrel (PLAVIX) 75 MG tablet Take 75 mg by mouth every evening.       Marland Kitchen dexamethasone (DECADRON) 4 MG tablet Take 8 mg by mouth. Take 2 tablets @ 9pm the night before chemo and take 2 tablets @ 3am the morning of chemo. Then starting the day after chemo, take 2 tablets in the am and 2 tablets in the pm for 2 days. Then take 1/2 tablet x 2 days.      Tery Sanfilippo Calcium (STOOL SOFTENER PO) Take 1 capsule by mouth every morning.      . fish oil-omega-3 fatty acids 1000 MG capsule Take 1 g by mouth every morning.      Marland Kitchen levothyroxine (SYNTHROID, LEVOTHROID) 75 MCG tablet Take 75 mcg by mouth every morning.       . lidocaine-prilocaine (EMLA) cream Apply 1 application topically as needed. Apply a quarter sized amount 1 hour prior to chemo. Do not rub in. Cover with plastic.      Marland Kitchen LORazepam (ATIVAN) 0.5 MG tablet Take 0.5 mg by mouth every 6 (six) hours as needed. May take 1-2 tablets every 6 hours IF needed for nausea/vomiting. May cause you to feel sleepy, drowsy, or make you unsteady on your feet. Do not operate machinery  or cook while under the influence of ativan.      . omeprazole (PRILOSEC) 20 MG capsule Take 20 mg by mouth 2 (two) times daily. Acid Reflux      . ondansetron (ZOFRAN) 8  MG tablet Take 8 mg by mouth. Starting the day after chemo, take 1 tablet in the am and 1 tablet in the pm for 2 days. Then may take 1 tablet two times a day IF needed for nausea/vomiting.      Marland Kitchen PACLitaxel (TAXOL IV) Inject into the vein every 21 ( twenty-one) days.      . pegfilgrastim (NEULASTA) 6 MG/0.6ML injection Inject 6 mg into the skin every 21 ( twenty-one) days. To be given at [facility]. Will be given 20-24 hours from the completion of chemotherapy.      . polyethylene glycol powder (GLYCOLAX/MIRALAX) powder Take 17 g by mouth daily as needed. Constipation      . prochlorperazine (COMPAZINE) 25 MG suppository Place 1  suppository (25 mg total) rectally every 6 (six) hours as needed for nausea.  12 suppository  2  . rosuvastatin (CRESTOR) 20 MG tablet Take 20 mg by mouth every evening.      . sucralfate (CARAFATE) 1 GM/10ML suspension Take 10 mLs (1 g total) by mouth 4 (four) times daily.  420 mL  1  . tiotropium (SPIRIVA) 18 MCG inhalation capsule Place 18 mcg into inhaler and inhale daily.       No current facility-administered medications for this encounter.   Facility-Administered Medications Ordered in Other Encounters  Medication Dose Route Frequency Provider Last Rate Last Dose  . [COMPLETED] 0.9 %  sodium chloride infusion   Intravenous Once Randall An, MD      . [COMPLETED] CARBOplatin (PARAPLATIN) 360 mg in sodium chloride 0.9 % 100 mL chemo infusion  360 mg Intravenous Once Randall An, MD   360 mg at 01/18/12 1429  . [COMPLETED] heparin lock flush 100 unit/mL  500 Units Intracatheter Once PRN Randall An, MD   500 Units at 01/18/12 1552  . [COMPLETED] pegfilgrastim (NEULASTA) injection 6 mg  6 mg Subcutaneous Once Randall An, MD   6 mg at 01/19/12 1408  . [DISCONTINUED] sodium chloride 0.9 % injection 10 mL  10 mL Intracatheter PRN Randall An, MD   10 mL at 01/18/12 0920    Physical Findings: The patient is in no acute distress. Patient is alert and oriented.  weight is 114 lb 6.4 oz (51.891 kg). Her temperature is 98.6 F (37 C). Her blood pressure is 124/50 and her pulse is 69. .  No significant changes. Sitting comfortably in a chair. High pitched voice. Oropharynx demonstrates no thrush. Extraocular movements are intact. No facial droop. Coordination intact by finger to nose testing.  Vision is grossly intact. Strength and sensation intact throughout her body. She ambulates independently. She is alert oriented and answering questions appropriately.  Lab Findings: Lab Results  Component Value Date   WBC 7.9 01/18/2012   HGB 10.8* 01/18/2012   HCT 33.1* 01/18/2012    MCV 87.8 01/18/2012   PLT 174 01/18/2012   Radiographic Findings: Dg Chest Portable 1 View  12/26/2011  *RADIOLOGY REPORT*  Clinical Data: Evaluate Port-A-Cath placement.  PORTABLE CHEST - 1 VIEW  Comparison: Multiple priors, most recently chest x-ray 10/30/2011.  Findings: Compared to the prior examination there is a new left subclavian single lumen power Port-A-Cath with tip terminating in the distal left innominate vein or proximal superior vena cava. The patient is rotated to the left distorting the cardiomediastinal silhouette making accurate assessment of the tip of the catheter challenging.  Lung volumes are low.  No pneumothorax. Persistent opacity in the base of the left  hemithorax compatible with left lower lobe atelectasis (postobstructive related to the known left hilar mass). Left hilar soft tissue prominence is unchanged. Possible small left pleural effusion.  No evidence of pulmonary edema.  Heart size is normal.  Atherosclerosis in the thoracic aorta.  Surgical clips in the right axillary region likely from prior nodal dissection.  Right apical pleuroparenchymal thickening is most compatible with scarring, and is unchanged.  IMPRESSION: 1.  Interval placement of a left subclavian power Port-A-Cath, as above, without definite pneumothorax or other immediate complicating features. 2.  Persistent left lower lobe postobstructive atelectasis with probable small left pleural effusion.  Soft tissue fullness in the left hilar region reflects known mass. 3.  Atherosclerosis.   Original Report Authenticated By: Florencia Reasons, M.D.     Impression/Plan: Doing well. She'll see neurosurgery in 2 months, following her brain MRI. We will alternate followups in NSU and Rad/Onc clinics every 3 months with MRIs preceding the followups. I spoke with her about possible radiation/radiosurgery to the brain in the future as salvage if she has new brain metastases. She knows to call if she needs advice in the  interim. _____________________________________   Lonie Peak, MD

## 2012-01-22 ENCOUNTER — Other Ambulatory Visit: Payer: Self-pay | Admitting: Radiation Therapy

## 2012-01-22 DIAGNOSIS — C7949 Secondary malignant neoplasm of other parts of nervous system: Secondary | ICD-10-CM

## 2012-01-29 ENCOUNTER — Inpatient Hospital Stay (HOSPITAL_COMMUNITY): Payer: Medicare Other

## 2012-02-02 ENCOUNTER — Other Ambulatory Visit (HOSPITAL_COMMUNITY): Payer: Self-pay | Admitting: *Deleted

## 2012-02-02 DIAGNOSIS — K219 Gastro-esophageal reflux disease without esophagitis: Secondary | ICD-10-CM

## 2012-02-02 DIAGNOSIS — R109 Unspecified abdominal pain: Secondary | ICD-10-CM

## 2012-02-02 MED ORDER — SUCRALFATE 1 GM/10ML PO SUSP: 1.0000 g | Freq: Four times a day (QID) | ORAL | Status: DC

## 2012-02-06 ENCOUNTER — Ambulatory Visit (HOSPITAL_COMMUNITY): Payer: Medicare Other | Admitting: Oncology

## 2012-02-06 ENCOUNTER — Encounter (HOSPITAL_BASED_OUTPATIENT_CLINIC_OR_DEPARTMENT_OTHER): Payer: Medicare Other

## 2012-02-06 DIAGNOSIS — C7949 Secondary malignant neoplasm of other parts of nervous system: Secondary | ICD-10-CM

## 2012-02-06 DIAGNOSIS — C3430 Malignant neoplasm of lower lobe, unspecified bronchus or lung: Secondary | ICD-10-CM

## 2012-02-06 DIAGNOSIS — C3490 Malignant neoplasm of unspecified part of unspecified bronchus or lung: Secondary | ICD-10-CM

## 2012-02-06 LAB — CBC WITH DIFFERENTIAL/PLATELET
Basophils Relative: 0 % (ref 0–1)
Eosinophils Absolute: 0 10*3/uL (ref 0.0–0.7)
Eosinophils Relative: 1 % (ref 0–5)
Hemoglobin: 10.8 g/dL — ABNORMAL LOW (ref 12.0–15.0)
MCH: 29.3 pg (ref 26.0–34.0)
MCHC: 32 g/dL (ref 30.0–36.0)
MCV: 91.8 fL (ref 78.0–100.0)
Monocytes Relative: 13 % — ABNORMAL HIGH (ref 3–12)
Neutrophils Relative %: 67 % (ref 43–77)
Platelets: 161 10*3/uL (ref 150–400)

## 2012-02-06 LAB — COMPREHENSIVE METABOLIC PANEL
Albumin: 3.3 g/dL — ABNORMAL LOW (ref 3.5–5.2)
Alkaline Phosphatase: 82 U/L (ref 39–117)
BUN: 13 mg/dL (ref 6–23)
Calcium: 10.3 mg/dL (ref 8.4–10.5)
Creatinine, Ser: 0.82 mg/dL (ref 0.50–1.10)
Potassium: 3.9 mEq/L (ref 3.5–5.1)
Total Protein: 6.3 g/dL (ref 6.0–8.3)

## 2012-02-06 NOTE — Progress Notes (Signed)
Labs drawn today cbc/diff,cmp 

## 2012-02-07 ENCOUNTER — Encounter (HOSPITAL_BASED_OUTPATIENT_CLINIC_OR_DEPARTMENT_OTHER): Payer: Medicare Other

## 2012-02-07 ENCOUNTER — Other Ambulatory Visit (HOSPITAL_COMMUNITY): Payer: Medicare Other

## 2012-02-07 ENCOUNTER — Ambulatory Visit (HOSPITAL_COMMUNITY): Payer: Medicare Other | Admitting: Oncology

## 2012-02-07 VITALS — BP 102/58 | HR 72 | Temp 97.8°F | Resp 18

## 2012-02-07 DIAGNOSIS — Z5111 Encounter for antineoplastic chemotherapy: Secondary | ICD-10-CM

## 2012-02-07 DIAGNOSIS — C7931 Secondary malignant neoplasm of brain: Secondary | ICD-10-CM

## 2012-02-07 DIAGNOSIS — C7949 Secondary malignant neoplasm of other parts of nervous system: Secondary | ICD-10-CM

## 2012-02-07 DIAGNOSIS — C3430 Malignant neoplasm of lower lobe, unspecified bronchus or lung: Secondary | ICD-10-CM

## 2012-02-07 DIAGNOSIS — C3490 Malignant neoplasm of unspecified part of unspecified bronchus or lung: Secondary | ICD-10-CM

## 2012-02-07 MED ORDER — DIPHENHYDRAMINE HCL 50 MG/ML IJ SOLN
50.0000 mg | Freq: Once | INTRAMUSCULAR | Status: AC
  Administered 2012-02-07: 50 mg via INTRAVENOUS

## 2012-02-07 MED ORDER — LORAZEPAM 2 MG/ML IJ SOLN
0.5000 mg | Freq: Once | INTRAMUSCULAR | Status: AC
  Administered 2012-02-07: 0.5 mg via INTRAVENOUS

## 2012-02-07 MED ORDER — DIPHENHYDRAMINE HCL 50 MG/ML IJ SOLN
INTRAMUSCULAR | Status: AC
  Filled 2012-02-07: qty 1

## 2012-02-07 MED ORDER — SODIUM CHLORIDE 0.9 % IJ SOLN
10.0000 mL | INTRAMUSCULAR | Status: DC | PRN
  Administered 2012-02-07: 10 mL
  Filled 2012-02-07: qty 10

## 2012-02-07 MED ORDER — PACLITAXEL CHEMO INJECTION 300 MG/50ML
175.0000 mg/m2 | Freq: Once | INTRAVENOUS | Status: AC
  Administered 2012-02-07: 264 mg via INTRAVENOUS
  Filled 2012-02-07: qty 44

## 2012-02-07 MED ORDER — LORAZEPAM 2 MG/ML IJ SOLN
INTRAMUSCULAR | Status: AC
  Filled 2012-02-07: qty 1

## 2012-02-07 MED ORDER — SODIUM CHLORIDE 0.9 % IV SOLN
Freq: Once | INTRAVENOUS | Status: AC
  Administered 2012-02-07: 16 mg via INTRAVENOUS
  Filled 2012-02-07: qty 8

## 2012-02-07 MED ORDER — LORAZEPAM 0.5 MG PO TABS: 0.5000 mg | ORAL_TABLET | Freq: Four times a day (QID) | ORAL | Status: DC | PRN

## 2012-02-07 MED ORDER — FAMOTIDINE IN NACL 20-0.9 MG/50ML-% IV SOLN
INTRAVENOUS | Status: AC
  Filled 2012-02-07: qty 50

## 2012-02-07 MED ORDER — SODIUM CHLORIDE 0.9 % IV SOLN
Freq: Once | INTRAVENOUS | Status: AC
  Administered 2012-02-07: 09:00:00 via INTRAVENOUS

## 2012-02-07 MED ORDER — HEPARIN SOD (PORK) LOCK FLUSH 100 UNIT/ML IV SOLN
500.0000 [IU] | Freq: Once | INTRAVENOUS | Status: AC | PRN
  Administered 2012-02-07: 500 [IU]
  Filled 2012-02-07: qty 5

## 2012-02-07 MED ORDER — FAMOTIDINE IN NACL 20-0.9 MG/50ML-% IV SOLN
20.0000 mg | Freq: Once | INTRAVENOUS | Status: AC
  Administered 2012-02-07: 20 mg via INTRAVENOUS

## 2012-02-07 MED ORDER — SODIUM CHLORIDE 0.9 % IV SOLN
363.0000 mg | Freq: Once | INTRAVENOUS | Status: AC
  Administered 2012-02-07: 360 mg via INTRAVENOUS
  Filled 2012-02-07: qty 36

## 2012-02-07 NOTE — Progress Notes (Signed)
Tolerated chemo well. 

## 2012-02-08 ENCOUNTER — Encounter (HOSPITAL_BASED_OUTPATIENT_CLINIC_OR_DEPARTMENT_OTHER): Payer: Medicare Other

## 2012-02-08 ENCOUNTER — Ambulatory Visit (HOSPITAL_COMMUNITY): Payer: Self-pay

## 2012-02-08 VITALS — BP 125/70 | HR 69 | Temp 97.4°F | Resp 16

## 2012-02-08 DIAGNOSIS — C7949 Secondary malignant neoplasm of other parts of nervous system: Secondary | ICD-10-CM

## 2012-02-08 DIAGNOSIS — C3490 Malignant neoplasm of unspecified part of unspecified bronchus or lung: Secondary | ICD-10-CM

## 2012-02-08 DIAGNOSIS — C3430 Malignant neoplasm of lower lobe, unspecified bronchus or lung: Secondary | ICD-10-CM

## 2012-02-08 MED ORDER — PEGFILGRASTIM INJECTION 6 MG/0.6ML
6.0000 mg | Freq: Once | SUBCUTANEOUS | Status: AC
  Administered 2012-02-08: 6 mg via SUBCUTANEOUS

## 2012-02-08 MED ORDER — PEGFILGRASTIM INJECTION 6 MG/0.6ML
SUBCUTANEOUS | Status: AC
  Filled 2012-02-08: qty 0.6

## 2012-02-08 NOTE — Progress Notes (Signed)
[name] presents today for injection per MD orders. Neulasta 6mg administered SQ in right Abdomen. Administration without incident. Patient tolerated well.  

## 2012-02-09 ENCOUNTER — Ambulatory Visit (HOSPITAL_COMMUNITY): Payer: Medicare Other

## 2012-02-12 ENCOUNTER — Ambulatory Visit (HOSPITAL_COMMUNITY): Payer: Medicare Other | Admitting: Oncology

## 2012-02-12 NOTE — Progress Notes (Signed)
This encounter was created in error - please disregard.

## 2012-02-16 ENCOUNTER — Other Ambulatory Visit (HOSPITAL_COMMUNITY): Payer: Self-pay | Admitting: Oncology

## 2012-02-16 DIAGNOSIS — C3490 Malignant neoplasm of unspecified part of unspecified bronchus or lung: Secondary | ICD-10-CM

## 2012-02-16 MED ORDER — OXYCODONE-ACETAMINOPHEN 5-325 MG PO TABS: 1.0000 | ORAL_TABLET | ORAL | Status: AC | PRN

## 2012-02-19 ENCOUNTER — Ambulatory Visit (HOSPITAL_COMMUNITY): Payer: Medicare Other | Admitting: Oncology

## 2012-02-19 ENCOUNTER — Encounter (HOSPITAL_COMMUNITY)
Admission: RE | Admit: 2012-02-19 | Discharge: 2012-02-19 | Disposition: A | Discharge status: Home / Self Care | Payer: Medicare Other | Source: Ambulatory Visit | Attending: Oncology | Admitting: Oncology

## 2012-02-19 DIAGNOSIS — N289 Disorder of kidney and ureter, unspecified: Secondary | ICD-10-CM | POA: Insufficient documentation

## 2012-02-19 DIAGNOSIS — J984 Other disorders of lung: Secondary | ICD-10-CM | POA: Insufficient documentation

## 2012-02-19 DIAGNOSIS — C7949 Secondary malignant neoplasm of other parts of nervous system: Secondary | ICD-10-CM

## 2012-02-19 DIAGNOSIS — I2510 Atherosclerotic heart disease of native coronary artery without angina pectoris: Secondary | ICD-10-CM | POA: Insufficient documentation

## 2012-02-19 DIAGNOSIS — C7931 Secondary malignant neoplasm of brain: Secondary | ICD-10-CM

## 2012-02-19 DIAGNOSIS — C3490 Malignant neoplasm of unspecified part of unspecified bronchus or lung: Secondary | ICD-10-CM | POA: Insufficient documentation

## 2012-02-19 MED ORDER — FLUDEOXYGLUCOSE F - 18 (FDG) INJECTION
14.7000 | Freq: Once | INTRAVENOUS | Status: AC | PRN
  Administered 2012-02-19: 14.7 via INTRAVENOUS

## 2012-02-21 ENCOUNTER — Encounter (HOSPITAL_COMMUNITY): Payer: Medicare Other | Attending: Oncology | Admitting: Oncology

## 2012-02-21 ENCOUNTER — Encounter (HOSPITAL_COMMUNITY): Payer: Self-pay | Admitting: Oncology

## 2012-02-21 VITALS — BP 112/73 | HR 64 | Temp 97.6°F | Resp 20 | Wt 111.9 lb

## 2012-02-21 DIAGNOSIS — C3490 Malignant neoplasm of unspecified part of unspecified bronchus or lung: Secondary | ICD-10-CM | POA: Insufficient documentation

## 2012-02-21 DIAGNOSIS — C3430 Malignant neoplasm of lower lobe, unspecified bronchus or lung: Secondary | ICD-10-CM

## 2012-02-21 DIAGNOSIS — C7931 Secondary malignant neoplasm of brain: Secondary | ICD-10-CM

## 2012-02-21 DIAGNOSIS — E039 Hypothyroidism, unspecified: Secondary | ICD-10-CM

## 2012-02-21 NOTE — Patient Instructions (Addendum)
..  [facility] Discharge Instructions  RECOMMENDATIONS MADE BY THE CONSULTANT AND ANY TEST RESULTS WILL BE SENT TO YOUR REFERRING PHYSICIAN.  EXAM FINDINGS BY THE PHYSICIAN TODAY AND SIGNS OR SYMPTOMS TO REPORT TO CLINIC OR PRIMARY PHYSICIAN: Exam per Dr. Mariel Sleet Scans discussed   INSTRUCTIONS GIVEN AND DISCUSSED To see Tom in 3 weeks Pet scan on the 27th then to see Dr. Mariel Sleet    Thank you for choosing [facility] to provide your oncology and hematology care.  To afford each patient quality time with our providers, please arrive at least 15 minutes before your scheduled appointment time.  With your help, our goal is to use those 15 minutes to complete the necessary work-up to ensure our physicians have the information they need to help with your evaluation and healthcare recommendations.    Effective January 1st, 2014, we ask that you re-schedule your appointment with our physicians should you arrive 10 or more minutes late for your appointment.  We strive to give you quality time with our providers, and arriving late affects you and other patients whose appointments are after yours.    Again, thank you for choosing [facility].  Our hope is that these requests will decrease the amount of time that you wait before being seen by our physicians.       _____________________________________________________________  I acknowledge that I have been informed and understand all the instructions given to me and received a copy. I do not have anymore questions at this time but understand that I may call the Cancer Center at [facility] at [phone] during business hours should I have any further questions or need assistance in obtaining follow-up care.    __________________________________________  _____________  __________ Signature of Patient or Authorized Representative            Date                    Time    __________________________________________ Nurse's Signature

## 2012-02-21 NOTE — Progress Notes (Signed)
Problem #1 metastatic squamous cell carcinoma of the lung to brain status post radiation therapy to the brain via SRS. She's also status post radiation therapy to the chest. She is now on chemotherapy of carboplatin and paclitaxel. She's had 3 cycles thus far. Problem #2 COPD secondary to long-standing smoking history Problem #3 history right-sided breast cancer status post mastectomy years ago. Problem #4 hypothyroidism This very pleasant lady is accompanied by her son and daughter. She has some nausea and one episode of vomiting proximal the 7-9 days after each chemotherapy treatment. Sublingual lorazepam also makes her gag at times. She did not remember that she can swallow at as well as take her suppository for antiemesis. She looks  But her weight is down several pounds. Her mouth ulcers have healed completely and the Carafate worked well in addition.  BP 112/73  Pulse 64  Temp 97.6 F (36.4 C) (Oral)  Resp 20  Wt 111 lb 14.4 oz (50.758 kg)  She is in no acute distress. She still has a very hoarse voice due to her paralysis of the one vocal cord. Lungs though are clear to auscultation and percussion. She has no lymphadenopathy in the cervical, supraclavicular, infraclavicular, axillary, or inguinal areas. Her Port-A-Cath remains intact. Heart shows a regular rhythm and rate without distinct murmur rub or gallop today. She has no tenderness over her ribs laterally or posteriorly. Her abdomen remains soft and nontender without hepatosplenomegaly. She has no leg edema no arm edema.  Her scan however shows possible new disease in the left 10th rib though no visible lesion is seen on the CT scan images. She has not had any trauma that she can remember but she is a frail woman.  After a long discussion with her no children I feel we must give her the benefit of the doubt and treat her with 2 more cycles of therapy and then repeat her PET scan in followup. If she has progressed in this site or in new  sites then chemotherapy is no longer an option. We will go with palliative hospice intervention at that point in time. I have not discussed hospice with them yet but they understand that this is about all the chemotherapy we have to offer her realistically. I will have my PA see her in 3 weeks and I will see her right after the scan

## 2012-02-28 ENCOUNTER — Other Ambulatory Visit (HOSPITAL_COMMUNITY): Payer: Medicare Other

## 2012-02-28 ENCOUNTER — Encounter (HOSPITAL_BASED_OUTPATIENT_CLINIC_OR_DEPARTMENT_OTHER): Payer: Medicare Other

## 2012-02-28 VITALS — BP 118/75 | HR 76 | Temp 97.8°F | Resp 18 | Wt 111.7 lb

## 2012-02-28 DIAGNOSIS — C7931 Secondary malignant neoplasm of brain: Secondary | ICD-10-CM

## 2012-02-28 DIAGNOSIS — C3430 Malignant neoplasm of lower lobe, unspecified bronchus or lung: Secondary | ICD-10-CM

## 2012-02-28 DIAGNOSIS — C3490 Malignant neoplasm of unspecified part of unspecified bronchus or lung: Secondary | ICD-10-CM

## 2012-02-28 DIAGNOSIS — Z5111 Encounter for antineoplastic chemotherapy: Secondary | ICD-10-CM

## 2012-02-28 LAB — COMPREHENSIVE METABOLIC PANEL
Alkaline Phosphatase: 84 U/L (ref 39–117)
BUN: 15 mg/dL (ref 6–23)
Chloride: 98 mEq/L (ref 96–112)
GFR calc Af Amer: >90 mL/min (ref >90)
Glucose, Bld: 177 mg/dL — ABNORMAL HIGH (ref 70–99)
Potassium: 4 mEq/L (ref 3.5–5.1)
Total Bilirubin: 0.3 mg/dL (ref 0.3–1.2)

## 2012-02-28 LAB — CBC WITH DIFFERENTIAL/PLATELET
Basophils Absolute: 0 10*3/uL (ref 0.0–0.1)
Basophils Relative: 0 % (ref 0–1)
Eosinophils Absolute: 0 10*3/uL (ref 0.0–0.7)
Eosinophils Relative: 0 % (ref 0–5)
HCT: 32.3 % — ABNORMAL LOW (ref 36.0–46.0)
MCHC: 32.8 g/dL (ref 30.0–36.0)
MCV: 92.3 fL (ref 78.0–100.0)
Monocytes Absolute: 0.2 10*3/uL (ref 0.1–1.0)
RDW: 18.3 % — ABNORMAL HIGH (ref 11.5–15.5)

## 2012-02-28 MED ORDER — HEPARIN SOD (PORK) LOCK FLUSH 100 UNIT/ML IV SOLN
500.0000 [IU] | Freq: Once | INTRAVENOUS | Status: AC | PRN
  Administered 2012-02-28: 500 [IU]
  Filled 2012-02-28: qty 5

## 2012-02-28 MED ORDER — DIPHENHYDRAMINE HCL 50 MG/ML IJ SOLN
INTRAMUSCULAR | Status: AC
  Filled 2012-02-28: qty 1

## 2012-02-28 MED ORDER — SODIUM CHLORIDE 0.9 % IV SOLN
363.0000 mg | Freq: Once | INTRAVENOUS | Status: AC
  Administered 2012-02-28: 360 mg via INTRAVENOUS
  Filled 2012-02-28: qty 36

## 2012-02-28 MED ORDER — DIPHENHYDRAMINE HCL 50 MG/ML IJ SOLN
50.0000 mg | Freq: Once | INTRAMUSCULAR | Status: AC
  Administered 2012-02-28: 50 mg via INTRAVENOUS

## 2012-02-28 MED ORDER — SODIUM CHLORIDE 0.9 % IV SOLN
Freq: Once | INTRAVENOUS | Status: AC
  Administered 2012-02-28: 09:00:00 via INTRAVENOUS

## 2012-02-28 MED ORDER — PACLITAXEL CHEMO INJECTION 300 MG/50ML
175.0000 mg/m2 | Freq: Once | INTRAVENOUS | Status: AC
  Administered 2012-02-28: 264 mg via INTRAVENOUS
  Filled 2012-02-28: qty 44

## 2012-02-28 MED ORDER — SODIUM CHLORIDE 0.9 % IJ SOLN
INTRAMUSCULAR | Status: AC
  Filled 2012-02-28: qty 10

## 2012-02-28 MED ORDER — SODIUM CHLORIDE 0.9 % IJ SOLN
10.0000 mL | INTRAMUSCULAR | Status: DC | PRN
  Administered 2012-02-28: 10 mL
  Filled 2012-02-28: qty 10

## 2012-02-28 MED ORDER — LORAZEPAM 2 MG/ML IJ SOLN
0.5000 mg | Freq: Once | INTRAMUSCULAR | Status: AC
  Administered 2012-02-28: 0.5 mg via INTRAVENOUS

## 2012-02-28 MED ORDER — LORAZEPAM 2 MG/ML IJ SOLN
INTRAMUSCULAR | Status: AC
  Filled 2012-02-28: qty 1

## 2012-02-28 MED ORDER — FAMOTIDINE IN NACL 20-0.9 MG/50ML-% IV SOLN
20.0000 mg | Freq: Once | INTRAVENOUS | Status: AC
  Administered 2012-02-28: 20 mg via INTRAVENOUS

## 2012-02-28 MED ORDER — HEPARIN SOD (PORK) LOCK FLUSH 100 UNIT/ML IV SOLN
INTRAVENOUS | Status: AC
  Filled 2012-02-28: qty 5

## 2012-02-28 MED ORDER — FAMOTIDINE IN NACL 20-0.9 MG/50ML-% IV SOLN
INTRAVENOUS | Status: AC
  Filled 2012-02-28: qty 50

## 2012-02-28 MED ORDER — SODIUM CHLORIDE 0.9 % IV SOLN
Freq: Once | INTRAVENOUS | Status: AC
  Administered 2012-02-28: 16 mg via INTRAVENOUS
  Filled 2012-02-28: qty 8

## 2012-02-28 NOTE — Progress Notes (Signed)
Tolerated well. Pt is experiencing some neuropathy however buttons and zippers without problem. She knows to keep [place] aware of any worsening of this. Home with daughter.

## 2012-02-28 NOTE — Patient Instructions (Addendum)
[facility] Discharge Instructions for Patients Receiving Chemotherapy  Today you received the following chemotherapy agents carboplatin and taxol  To help prevent nausea and vomiting after your treatment, we encourage you to take your nausea medication   Dexamethasone  Take 2 tablets @ 9pm the night before chemo and take 2 tablets @ 3am the morning of chemo. Then starting the day after chemo, take 2 tablets in the am and 2 tablets in the pm for 2 days. Then take 1/2 tablet x 2 days. Lorazepam : Take 1-2 tablets (0.5-1 mg total) by mouth every 6 (six) hours as needed (nausea/vomiting zofran  Starting the day after chemo, take 1 tablet in the am and 1 tablet in the pm for 2 days. Then may take 1 tablet two times a day IF needed for nausea/vomiting. Compazine suppositories Place 1 suppository (25 mg total) rectally every 6 (six) hours as needed for nausea.   neulasta injection tomorrow    If you develop nausea and vomiting that is not controlled by your nausea medication, call the clinic. If it is after clinic hours your family physician or the after hours number for the clinic or go to the Emergency Department. Let us know if numbness worsens after today's treatment.  BELOW ARE SYMPTOMS THAT SHOULD BE REPORTED IMMEDIATELY:  *FEVER GREATER THAN 101.0 F  *CHILLS WITH OR WITHOUT FEVER  NAUSEA AND VOMITING THAT IS NOT CONTROLLED WITH YOUR NAUSEA MEDICATION  *UNUSUAL SHORTNESS OF BREATH  *UNUSUAL BRUISING OR BLEEDING  TENDERNESS IN MOUTH AND THROAT WITH OR WITHOUT PRESENCE OF ULCERS  *URINARY PROBLEMS  *BOWEL PROBLEMS  UNUSUAL RASH Items with * indicate a potential emergency and should be followed up as soon as possible.  One of the nurses will contact you 24 hours after your treatment. Please let the nurse know about any problems that you may have experienced. Feel free to call the clinic you have any questions or concerns. The clinic phone number is [phone]  [phone].   I have been informed and understand all the instructions given to me. I know to contact the clinic, my physician, or go to the Emergency Department if any problems should occur. I do not have any questions at this time, but understand that I may call the clinic during office hours or the Patient Navigator at [phone] should I have any questions or need assistance in obtaining follow up care.    __________________________________________  _____________  __________ Signature of Patient or Authorized Representative            Date                   Time    __________________________________________ Nurse's Signature

## 2012-02-29 ENCOUNTER — Encounter (HOSPITAL_BASED_OUTPATIENT_CLINIC_OR_DEPARTMENT_OTHER): Payer: Medicare Other

## 2012-02-29 VITALS — BP 119/67 | HR 82 | Temp 97.5°F | Resp 18

## 2012-02-29 DIAGNOSIS — C3490 Malignant neoplasm of unspecified part of unspecified bronchus or lung: Secondary | ICD-10-CM

## 2012-02-29 DIAGNOSIS — C3430 Malignant neoplasm of lower lobe, unspecified bronchus or lung: Secondary | ICD-10-CM

## 2012-02-29 DIAGNOSIS — C7931 Secondary malignant neoplasm of brain: Secondary | ICD-10-CM

## 2012-02-29 MED ORDER — PEGFILGRASTIM INJECTION 6 MG/0.6ML
6.0000 mg | Freq: Once | SUBCUTANEOUS | Status: AC
  Administered 2012-02-29: 6 mg via SUBCUTANEOUS

## 2012-02-29 MED ORDER — PEGFILGRASTIM INJECTION 6 MG/0.6ML
SUBCUTANEOUS | Status: AC
  Filled 2012-02-29: qty 0.6

## 2012-02-29 NOTE — Progress Notes (Signed)
[name] presents today for injection per MD orders. Neulasta 6mg administered SQ in right Abdomen. Administration without incident. Patient tolerated well.  

## 2012-03-19 ENCOUNTER — Encounter (HOSPITAL_COMMUNITY): Payer: Medicare Other | Attending: Oncology

## 2012-03-19 DIAGNOSIS — C3490 Malignant neoplasm of unspecified part of unspecified bronchus or lung: Secondary | ICD-10-CM | POA: Insufficient documentation

## 2012-03-19 DIAGNOSIS — C3430 Malignant neoplasm of lower lobe, unspecified bronchus or lung: Secondary | ICD-10-CM

## 2012-03-19 LAB — CBC
Hemoglobin: 10.4 g/dL — ABNORMAL LOW (ref 12.0–15.0)
MCH: 31 pg (ref 26.0–34.0)
RBC: 3.35 MIL/uL — ABNORMAL LOW (ref 3.87–5.11)

## 2012-03-19 LAB — COMPREHENSIVE METABOLIC PANEL
ALT: 7 U/L (ref 0–35)
Alkaline Phosphatase: 78 U/L (ref 39–117)
CO2: 28 mEq/L (ref 19–32)
Calcium: 10.6 mg/dL — ABNORMAL HIGH (ref 8.4–10.5)
GFR calc Af Amer: 79 mL/min — ABNORMAL LOW (ref >90)
GFR calc non Af Amer: 68 mL/min — ABNORMAL LOW (ref >90)
Glucose, Bld: 100 mg/dL — ABNORMAL HIGH (ref 70–99)
Potassium: 4.2 mEq/L (ref 3.5–5.1)
Sodium: 133 mEq/L — ABNORMAL LOW (ref 135–145)

## 2012-03-19 LAB — DIFFERENTIAL
Basophils Absolute: 0 10*3/uL (ref 0.0–0.1)
Eosinophils Absolute: 0 10*3/uL (ref 0.0–0.7)
Eosinophils Relative: 1 % (ref 0–5)

## 2012-03-19 NOTE — Progress Notes (Signed)
Labs drawn today for cbc/diff,cmp 

## 2012-03-20 ENCOUNTER — Encounter (HOSPITAL_BASED_OUTPATIENT_CLINIC_OR_DEPARTMENT_OTHER): Payer: Medicare Other

## 2012-03-20 VITALS — BP 119/69 | HR 88 | Temp 97.6°F | Resp 18 | Wt 111.2 lb

## 2012-03-20 DIAGNOSIS — C3490 Malignant neoplasm of unspecified part of unspecified bronchus or lung: Secondary | ICD-10-CM

## 2012-03-20 DIAGNOSIS — C7931 Secondary malignant neoplasm of brain: Secondary | ICD-10-CM

## 2012-03-20 DIAGNOSIS — C3430 Malignant neoplasm of lower lobe, unspecified bronchus or lung: Secondary | ICD-10-CM

## 2012-03-20 DIAGNOSIS — Z5111 Encounter for antineoplastic chemotherapy: Secondary | ICD-10-CM

## 2012-03-20 MED ORDER — FAMOTIDINE IN NACL 20-0.9 MG/50ML-% IV SOLN
INTRAVENOUS | Status: AC
  Filled 2012-03-20: qty 50

## 2012-03-20 MED ORDER — LORAZEPAM 2 MG/ML IJ SOLN
0.5000 mg | Freq: Once | INTRAMUSCULAR | Status: AC
  Administered 2012-03-20: 0.5 mg via INTRAVENOUS

## 2012-03-20 MED ORDER — DIPHENHYDRAMINE HCL 50 MG/ML IJ SOLN
INTRAMUSCULAR | Status: AC
  Filled 2012-03-20: qty 1

## 2012-03-20 MED ORDER — SODIUM CHLORIDE 0.9 % IV SOLN
Freq: Once | INTRAVENOUS | Status: AC
  Administered 2012-03-20: 16 mg via INTRAVENOUS
  Filled 2012-03-20: qty 8

## 2012-03-20 MED ORDER — HEPARIN SOD (PORK) LOCK FLUSH 100 UNIT/ML IV SOLN
INTRAVENOUS | Status: AC
  Filled 2012-03-20: qty 5

## 2012-03-20 MED ORDER — SODIUM CHLORIDE 0.9 % IV SOLN
Freq: Once | INTRAVENOUS | Status: AC
  Administered 2012-03-20: 09:00:00 via INTRAVENOUS

## 2012-03-20 MED ORDER — SODIUM CHLORIDE 0.9 % IV SOLN
363.0000 mg | Freq: Once | INTRAVENOUS | Status: AC
  Administered 2012-03-20: 360 mg via INTRAVENOUS
  Filled 2012-03-20: qty 36

## 2012-03-20 MED ORDER — DIPHENHYDRAMINE HCL 50 MG/ML IJ SOLN
50.0000 mg | Freq: Once | INTRAMUSCULAR | Status: AC
  Administered 2012-03-20: 50 mg via INTRAVENOUS

## 2012-03-20 MED ORDER — LORAZEPAM 2 MG/ML IJ SOLN
INTRAMUSCULAR | Status: AC
  Filled 2012-03-20: qty 1

## 2012-03-20 MED ORDER — FAMOTIDINE IN NACL 20-0.9 MG/50ML-% IV SOLN
20.0000 mg | Freq: Once | INTRAVENOUS | Status: AC
  Administered 2012-03-20: 20 mg via INTRAVENOUS

## 2012-03-20 MED ORDER — HEPARIN SOD (PORK) LOCK FLUSH 100 UNIT/ML IV SOLN
500.0000 [IU] | Freq: Once | INTRAVENOUS | Status: AC | PRN
  Administered 2012-03-20: 500 [IU]
  Filled 2012-03-20: qty 5

## 2012-03-20 MED ORDER — PACLITAXEL CHEMO INJECTION 300 MG/50ML
175.0000 mg/m2 | Freq: Once | INTRAVENOUS | Status: AC
  Administered 2012-03-20: 264 mg via INTRAVENOUS
  Filled 2012-03-20: qty 44

## 2012-03-21 ENCOUNTER — Encounter (HOSPITAL_BASED_OUTPATIENT_CLINIC_OR_DEPARTMENT_OTHER): Payer: Medicare Other

## 2012-03-21 VITALS — BP 141/69 | HR 83 | Temp 98.1°F | Resp 18

## 2012-03-21 DIAGNOSIS — C3430 Malignant neoplasm of lower lobe, unspecified bronchus or lung: Secondary | ICD-10-CM

## 2012-03-21 DIAGNOSIS — C3490 Malignant neoplasm of unspecified part of unspecified bronchus or lung: Secondary | ICD-10-CM

## 2012-03-21 DIAGNOSIS — C7931 Secondary malignant neoplasm of brain: Secondary | ICD-10-CM

## 2012-03-21 MED ORDER — PEGFILGRASTIM INJECTION 6 MG/0.6ML
6.0000 mg | Freq: Once | SUBCUTANEOUS | Status: AC
  Administered 2012-03-21: 6 mg via SUBCUTANEOUS

## 2012-03-21 MED ORDER — PEGFILGRASTIM INJECTION 6 MG/0.6ML
SUBCUTANEOUS | Status: AC
  Filled 2012-03-21: qty 0.6

## 2012-03-21 NOTE — Progress Notes (Signed)
[name] presents today for injection per MD orders. Neulasta 6mg  administered SQ in right Abdomen. Administration without incident. Patient tolerated well.

## 2012-03-22 ENCOUNTER — Encounter (HOSPITAL_COMMUNITY): Payer: Self-pay | Admitting: Emergency Medicine

## 2012-03-22 ENCOUNTER — Ambulatory Visit
Admission: RE | Admit: 2012-03-22 | Discharge: 2012-03-22 | Disposition: A | Discharge status: Home / Self Care | Payer: Medicare Other | Source: Ambulatory Visit | Attending: Radiation Oncology | Admitting: Radiation Oncology

## 2012-03-22 DIAGNOSIS — J4489 Other specified chronic obstructive pulmonary disease: Secondary | ICD-10-CM | POA: Insufficient documentation

## 2012-03-22 DIAGNOSIS — Z853 Personal history of malignant neoplasm of breast: Secondary | ICD-10-CM | POA: Insufficient documentation

## 2012-03-22 DIAGNOSIS — F411 Generalized anxiety disorder: Secondary | ICD-10-CM | POA: Insufficient documentation

## 2012-03-22 DIAGNOSIS — E039 Hypothyroidism, unspecified: Secondary | ICD-10-CM | POA: Insufficient documentation

## 2012-03-22 DIAGNOSIS — I739 Peripheral vascular disease, unspecified: Secondary | ICD-10-CM | POA: Insufficient documentation

## 2012-03-22 DIAGNOSIS — R0602 Shortness of breath: Secondary | ICD-10-CM | POA: Insufficient documentation

## 2012-03-22 DIAGNOSIS — Z87891 Personal history of nicotine dependence: Secondary | ICD-10-CM | POA: Insufficient documentation

## 2012-03-22 DIAGNOSIS — C7931 Secondary malignant neoplasm of brain: Secondary | ICD-10-CM | POA: Insufficient documentation

## 2012-03-22 DIAGNOSIS — J449 Chronic obstructive pulmonary disease, unspecified: Secondary | ICD-10-CM | POA: Insufficient documentation

## 2012-03-22 DIAGNOSIS — C3490 Malignant neoplasm of unspecified part of unspecified bronchus or lung: Secondary | ICD-10-CM | POA: Insufficient documentation

## 2012-03-22 DIAGNOSIS — Z79899 Other long term (current) drug therapy: Secondary | ICD-10-CM | POA: Insufficient documentation

## 2012-03-22 MED ORDER — GADOBENATE DIMEGLUMINE 529 MG/ML IV SOLN
10.0000 mL | Freq: Once | INTRAVENOUS | Status: AC | PRN
  Administered 2012-03-22: 10 mL via INTRAVENOUS

## 2012-03-22 NOTE — ED Notes (Signed)
Patient states she felt ok until she laid down to go to bed and became short of breath.  Patient speaking in short sentences with shortness of breath noted.  Patient used a nebulizer and didn't feel any better.

## 2012-03-23 ENCOUNTER — Emergency Department (HOSPITAL_COMMUNITY)
Admission: EM | Admit: 2012-03-23 | Discharge: 2012-03-23 | Disposition: A | Discharge status: Home / Self Care | Payer: Medicare Other | Attending: Emergency Medicine | Admitting: Emergency Medicine

## 2012-03-23 ENCOUNTER — Emergency Department (HOSPITAL_COMMUNITY): Payer: Medicare Other

## 2012-03-23 DIAGNOSIS — F419 Anxiety disorder, unspecified: Secondary | ICD-10-CM

## 2012-03-23 DIAGNOSIS — J449 Chronic obstructive pulmonary disease, unspecified: Secondary | ICD-10-CM

## 2012-03-23 LAB — BASIC METABOLIC PANEL
CO2: 25 mEq/L (ref 19–32)
Calcium: 10.8 mg/dL — ABNORMAL HIGH (ref 8.4–10.5)
Creatinine, Ser: 0.8 mg/dL (ref 0.50–1.10)
GFR calc Af Amer: 80 mL/min — ABNORMAL LOW (ref >90)
GFR calc non Af Amer: 69 mL/min — ABNORMAL LOW (ref >90)
Sodium: 134 mEq/L — ABNORMAL LOW (ref 135–145)

## 2012-03-23 LAB — CBC WITH DIFFERENTIAL/PLATELET
Basophils Absolute: 0 10*3/uL (ref 0.0–0.1)
Basophils Relative: 0 % (ref 0–1)
Eosinophils Absolute: 0 10*3/uL (ref 0.0–0.7)
Eosinophils Relative: 0 % (ref 0–5)
HCT: 30.2 % — ABNORMAL LOW (ref 36.0–46.0)
MCH: 31.7 pg (ref 26.0–34.0)
MCHC: 33.8 g/dL (ref 30.0–36.0)
MCV: 93.8 fL (ref 78.0–100.0)
Monocytes Absolute: 0.3 10*3/uL (ref 0.1–1.0)
Platelets: 113 10*3/uL — ABNORMAL LOW (ref 150–400)
RDW: 17.7 % — ABNORMAL HIGH (ref 11.5–15.5)

## 2012-03-23 MED ORDER — HYDROMORPHONE HCL PF 1 MG/ML IJ SOLN
0.5000 mg | Freq: Once | INTRAMUSCULAR | Status: AC
  Administered 2012-03-23: 0.5 mg via INTRAVENOUS
  Filled 2012-03-23: qty 1

## 2012-03-23 MED ORDER — ALBUTEROL SULFATE (5 MG/ML) 0.5% IN NEBU
2.5000 mg | INHALATION_SOLUTION | Freq: Once | RESPIRATORY_TRACT | Status: AC
  Administered 2012-03-23: 2.5 mg via RESPIRATORY_TRACT
  Filled 2012-03-23: qty 0.5

## 2012-03-23 MED ORDER — IPRATROPIUM BROMIDE 0.02 % IN SOLN
0.5000 mg | Freq: Once | RESPIRATORY_TRACT | Status: AC
  Administered 2012-03-23: 0.5 mg via RESPIRATORY_TRACT
  Filled 2012-03-23: qty 2.5

## 2012-03-23 NOTE — ED Provider Notes (Signed)
History     CSN: [id number]  Arrival date & time 03/22/12  2305   First MD Initiated Contact with Patient 03/23/12 0029      Chief Complaint  Patient presents with  . COPD  . Shortness of Breath    (Consider location/radiation/quality/duration/timing/severity/associated sxs/prior treatment) HPIBetty Judie Petit Robbins is a 79 y.o. female with a pertinent medical history of remote right-sided breast cancer as well as current squamous cell lung carcinoma, stage IV being treated by Dr. Laurie Panda, she had her most recent chemotherapy with carboplatin treatment done 2 days ago. Patient also has a history of COPD and some anxiety for which she takes Ativan.  According to her son, occasionally a couple days after chemotherapy the patient will have some anxiety. Patient laid down tonight after having some Ativan and felt short of breath. Initially her symptoms were severe, they're currently mild, is been no associated chest pain, dizziness, lightheadedness, fevers or chills, new cough, running nose, sore throat, rash, myalgias, abdominal pain, nausea or vomiting.   Past Medical History  Diagnosis Date  . COPD (chronic obstructive pulmonary disease)   . Cancer     breast  . Peripheral vascular disease   . Hilar mass     left lung  . Breast cancer   . Squamous cell carcinoma lung     breast   . Hypothyroidism   . S/P radiation therapy lung    [facility] years ago at [facility] per patient report  . On antineoplastic chemotherapy 12/28/11    1 cycle Carbo/Taxol with Neulasta Support  . S/P radiation therapy 12/25/11    Left Parietal Lobe/ 20 Gray/ 1 Fraction    Past Surgical History  Procedure Date  . Mastectomy     rt side  . Abdominal hysterectomy   . Flexible bronchoscopy 11/09/2011    Procedure: FLEXIBLE BRONCHOSCOPY;  Surgeon: Fredirick Maudlin, MD;  Location: [facility];  Service: Pulmonary;  Laterality: Left;  . Thyroidectomy   . External ear surgery   . Neck mass excision     1960's  . Femoral artery  stent 2009  . Portacath placement 12/26/2011    Procedure: INSERTION PORT-A-CATH;  Surgeon: Marlane Hatcher, MD;  Location: [facility];  Service: General;  Laterality: Left;  left subclavian    Family History  Problem Relation Age of Onset  . Cancer Sister   . Cancer Brother   . Clotting disorder Brother     History  Substance Use Topics  . Smoking status: Former Smoker -- 1.0 packs/day for 25 years    Types: Cigarettes  . Smokeless tobacco: Former Neurosurgeon    Types: Snuff  . Alcohol Use: No    OB History    Grav Para Term Preterm Abortions TAB SAB Ect Mult Living                  Review of Systems At least 10pt or greater review of systems completed and are negative except where specified in the HPI.  Allergies  Sulfa antibiotics; Neosporin; Aspirin; and Codeine  Home Medications   Current Outpatient Rx  Name  Route  Sig  Dispense  Refill  . ALBUTEROL SULFATE (2.5 MG/3ML) 0.083% IN NEBU   Nebulization   Take 2.5 mg by nebulization every 4 (four) hours as needed. Shortness of Breath         . BUDESONIDE-FORMOTEROL FUMARATE 160-4.5 MCG/ACT IN AERO   Inhalation   Inhale 2 puffs into the lungs 2 (two) times daily.         Marland Kitchen  CARBOPLATIN IV   Intravenous   Inject into the vein every 21 ( twenty-one) days.         Marland Kitchen VITAMIN D PO   Oral   Take 2,000 Units by mouth daily.          Marland Kitchen CLOPIDOGREL BISULFATE 75 MG PO TABS   Oral   Take 75 mg by mouth every evening.          Marland Kitchen DEXAMETHASONE 4 MG PO TABS   Oral   Take 8 mg by mouth. Take 2 tablets @ 9pm the night before chemo and take 2 tablets @ 3am the morning of chemo. Then starting the day after chemo, take 2 tablets in the am and 2 tablets in the pm for 2 days. Then take 1/2 tablet x 2 days.         . STOOL SOFTENER PO   Oral   Take 1 capsule by mouth every morning.         Marland Kitchen OMEGA-3 FATTY ACIDS 1000 MG PO CAPS   Oral   Take 1 g by mouth every morning.         Marland Kitchen LEVOTHYROXINE SODIUM 75 MCG PO  TABS   Oral   Take 75 mcg by mouth every morning.          Marland Kitchen LIDOCAINE-PRILOCAINE 2.5-2.5 % EX CREA   Topical   Apply 1 application topically as needed. Apply a quarter sized amount 1 hour prior to chemo. Do not rub in. Cover with plastic.         Marland Kitchen LORAZEPAM 0.5 MG PO TABS   Oral   Take 1-2 tablets (0.5-1 mg total) by mouth every 6 (six) hours as needed (nausea/vomiting). May take 1-2 tablets every 6 hours IF needed for nausea/vomiting. May cause you to feel sleepy, drowsy, or make you unsteady on your feet. Do not operate machinery  or cook while under the influence of ativan.   30 tablet   1   . OMEPRAZOLE 20 MG PO CPDR   Oral   Take 20 mg by mouth 2 (two) times daily. Acid Reflux         . ONDANSETRON HCL 8 MG PO TABS   Oral   Take 8 mg by mouth. Starting the day after chemo, take 1 tablet in the am and 1 tablet in the pm for 2 days. Then may take 1 tablet two times a day IF needed for nausea/vomiting.         Marland Kitchen TAXOL IV   Intravenous   Inject into the vein every 21 ( twenty-one) days.         Marland Kitchen PEGFILGRASTIM INJECTION 6 MG/0.6ML   Subcutaneous   Inject 6 mg into the skin every 21 ( twenty-one) days. To be given at [facility]. Will be given 20-24 hours from the completion of chemotherapy.         Marland Kitchen POLYETHYLENE GLYCOL 3350 PO POWD   Oral   Take 17 g by mouth daily as needed. Constipation         . PROCHLORPERAZINE 25 MG RE SUPP   Rectal   Place 1 suppository (25 mg total) rectally every 6 (six) hours as needed for nausea.   12 suppository   2   . ROSUVASTATIN CALCIUM 20 MG PO TABS   Oral   Take 20 mg by mouth every evening.         . SUCRALFATE 1 GM/10ML PO SUSP   Oral  Take 10 mLs (1 g total) by mouth 4 (four) times daily.   420 mL   1   . TIOTROPIUM BROMIDE MONOHYDRATE 18 MCG IN CAPS   Inhalation   Place 18 mcg into inhaler and inhale daily.           Pulse 84  Temp 97.9 F (36.6 C) (Oral)  Resp 22  Ht 5\' 4"  (1.626 m)  Wt 111 lb  (50.349 kg)  BMI 19.05 kg/m2  SpO2 96%  Physical Exam  Nursing notes reviewed.  Electronic medical record reviewed. VITAL SIGNS:   Filed Vitals:   03/22/12 2329 03/23/12 0030 03/23/12 0031  Pulse: 84    Temp: 97.9 F (36.6 C)    TempSrc: Oral    Resp: 22    Height: 5\' 4"  (1.626 m)    Weight: 111 lb (50.349 kg)    SpO2: 92% 96% 96%   CONSTITUTIONAL: Awake, oriented, appears non-toxic HENT: Atraumatic, normocephalic, oral mucosa pink and moist, airway patent. Nares patent without drainage. External ears normal. Edentulous, high-pitched voice. EYES: Conjunctiva clear, EOMI, PERRLA NECK: Trachea midline, non-tender, supple CARDIOVASCULAR: Normal heart rate, Normal rhythm, No murmurs, rubs, gallops PULMONARY/CHEST: Clear to auscultation, no rhonchi, wheezes, or rales. Symmetrical breath sounds. Non-tender. Right-sided mastectomy ABDOMINAL: Non-distended, soft, non-tender - no rebound or guarding.  BS normal. NEUROLOGIC: Non-focal, moving all four extremities, no gross sensory or motor deficits. EXTREMITIES: No clubbing, cyanosis, or edema SKIN: Warm, Dry, No erythema, No rash  ED Course  Procedures (including critical care time)  Labs Reviewed  CBC WITH DIFFERENTIAL - Abnormal; Notable for the following:    WBC 19.1 (*)     RBC 3.22 (*)     Hemoglobin 10.2 (*)     HCT 30.2 (*)     RDW 17.7 (*)     Platelets 113 (*)     Neutrophils Relative 97 (*)     Neutro Abs 18.5 (*)     Lymphocytes Relative 1 (*)     Lymphs Abs 0.2 (*)     Monocytes Relative 2 (*)     All other components within normal limits  BASIC METABOLIC PANEL - Abnormal; Notable for the following:    Sodium 134 (*)     Potassium 3.4 (*)     Glucose, Bld 146 (*)     Calcium 10.8 (*)     GFR calc non Af Amer 69 (*)     GFR calc Af Amer 80 (*)     All other components within normal limits   Mr Laqueta Jean Wo Contrast  03/22/2012  *RADIOLOGY REPORT*  Clinical Data: 79 year old female with metastatic cancer.  SRS  treatment in October.  Restaging.  MRI HEAD WITHOUT AND WITH CONTRAST  Technique:  Multiplanar, multiecho pulse sequences of the brain and surrounding structures were obtained according to standard protocol without and with intravenous contrast  Contrast: 10mL MULTIHANCE GADOBENATE DIMEGLUMINE 529 MG/ML IV SOLN  Comparison: Pretreatment study 12/08/2011.  Findings: Progression of the ring enhancing lesion in the left superior parietal lobe, now measuring 4-5 mm diameter (previously 12 mm) on series 10 image 111.  No other enhancing lesion is identified throughout the brain.  Mild cerebral edema has resolved.  Stable gray and white matter signal elsewhere. No restricted diffusion to suggest acute infarction.  No ventriculomegaly.  No intracranial mass effect. Negative pituitary, cervicomedullary junction and visualized cervical spinal cord. Major intracranial vascular flow voids are stable. No acute intracranial hemorrhage identified.  Postoperative changes to  the globes.  Sphenoid sinus and right maxillary sinus mucosal thickening has not significantly changed. Previous right mastoidectomy suspected.  Stable bone marrow signal.  IMPRESSION: 1.  Regression of the solitary left superior parietal lobe metastasis. 2.  No new intracranial metastasis or abnormality identified.   Original Report Authenticated By: Erskine Speed, M.D.    Dg Chest Portable 1 View  03/23/2012  *RADIOLOGY REPORT*  Clinical Data: Short of breath, COPD  PORTABLE CHEST - 1 VIEW  Comparison: Chest radiograph 12/26/2011  Findings: Left power port in place.  Normal cardiac silhouette.  No effusion, infiltrate, or pneumothorax.  Chronic scarring at the left lung base laterally.  Right lymphadenectomy clips noted.  IMPRESSION: No acute findings.  Hyperinflated lungs.  Chronic scarring at the left lung base.   Original Report Authenticated By: Genevive Bi, M.D.      1. COPD (chronic obstructive pulmonary disease)   2. Anxiety       Medications  albuterol (PROVENTIL) (5 MG/ML) 0.5% nebulizer solution 2.5 mg (2.5 mg Nebulization Given 03/23/12 0021)  ipratropium (ATROVENT) nebulizer solution 0.5 mg (0.5 mg Nebulization Given 03/23/12 0021)  HYDROmorphone (DILAUDID) injection 0.5 mg (0.5 mg Intravenous Given 03/23/12 0129)     MDM  [name] is a 79 y.o. female with metastatic lung cancer including a single metastases to the brain -which does show some regression per MRI done yesterday, presents with shortness of breath. Patient is feeling much better now, on physical exam, the patient's breath sounds are very good for COPD and lung cancer-good air movement, no wheezing or rales. Obtain a chest x-ray which was unremarkable, no infiltrate seen. Labs are otherwise unremarkable as well-patient has a leukocytosis which is likely secondary to Neulasta injection following her carboplatin treatments.  Think the patient likely is having an anxiety issue, patient currently has Ativan at home. Treat her in the emergency department with a small amount along the way she feels much better with. Patient is much more calm, lungs sound the same. I do not think this patient has a pulmonary embolism, patient has a Wells score of 1 for a current treated malignancy, but I do not think that pulmonary embolism is her most likely diagnosis, I think she is low risk at this point and will not need any further testing. Likewise I do not think that this shortness of breath without any objective physical signs or symptoms is related to acute coronary syndrome. She's having no chest pain, do not think an EKG is indicated at this time.  Patient is to use her medications at home as needed, and to followup with her oncologist as directed  I explained the diagnosis and have given explicit precautions to return to the ER including or any other new or worsening symptoms. The patient understands and accepts the medical plan as it's been dictated and I have  answered their questions. Discharge instructions concerning home care and prescriptions have been given.  The patient is STABLE and is discharged to home in good condition.          Jones Skene, MD 03/24/12 [phone]

## 2012-03-25 ENCOUNTER — Other Ambulatory Visit: Payer: Medicare Other

## 2012-03-27 ENCOUNTER — Other Ambulatory Visit: Payer: Self-pay | Admitting: Radiation Therapy

## 2012-03-27 DIAGNOSIS — C7931 Secondary malignant neoplasm of brain: Secondary | ICD-10-CM

## 2012-04-04 ENCOUNTER — Encounter (HOSPITAL_COMMUNITY)
Admission: RE | Admit: 2012-04-04 | Discharge: 2012-04-04 | Disposition: A | Discharge status: Home / Self Care | Payer: Medicare Other | Source: Ambulatory Visit | Attending: Oncology | Admitting: Oncology

## 2012-04-04 ENCOUNTER — Encounter (HOSPITAL_COMMUNITY): Payer: Self-pay

## 2012-04-04 DIAGNOSIS — C3490 Malignant neoplasm of unspecified part of unspecified bronchus or lung: Secondary | ICD-10-CM | POA: Insufficient documentation

## 2012-04-04 LAB — GLUCOSE, CAPILLARY: Glucose-Capillary: 88 mg/dL (ref 70–99)

## 2012-04-04 MED ORDER — FLUDEOXYGLUCOSE F - 18 (FDG) INJECTION
17.4000 | Freq: Once | INTRAVENOUS | Status: AC | PRN
  Administered 2012-04-04: 17.4 via INTRAVENOUS

## 2012-04-08 ENCOUNTER — Encounter (HOSPITAL_BASED_OUTPATIENT_CLINIC_OR_DEPARTMENT_OTHER): Payer: Medicare Other | Admitting: Oncology

## 2012-04-08 VITALS — BP 116/71 | HR 94 | Temp 98.0°F | Resp 18 | Wt 112.2 lb

## 2012-04-08 DIAGNOSIS — G609 Hereditary and idiopathic neuropathy, unspecified: Secondary | ICD-10-CM

## 2012-04-08 DIAGNOSIS — C7931 Secondary malignant neoplasm of brain: Secondary | ICD-10-CM

## 2012-04-08 DIAGNOSIS — C3430 Malignant neoplasm of lower lobe, unspecified bronchus or lung: Secondary | ICD-10-CM

## 2012-04-08 DIAGNOSIS — C3490 Malignant neoplasm of unspecified part of unspecified bronchus or lung: Secondary | ICD-10-CM

## 2012-04-08 MED ORDER — OXYCODONE-ACETAMINOPHEN 5-325 MG PO TABS: 1.0000 | ORAL_TABLET | ORAL | Status: DC | PRN

## 2012-04-08 NOTE — Patient Instructions (Addendum)
[facility] Discharge Instructions  RECOMMENDATIONS MADE BY THE CONSULTANT AND ANY TEST RESULTS WILL BE SENT TO YOUR REFERRING PHYSICIAN.  EXAM FINDINGS BY THE PHYSICIAN TODAY AND SIGNS OR SYMPTOMS TO REPORT TO CLINIC OR PRIMARY PHYSICIAN: Discussion by MD.  Scans were great and we will stop chemotherapy.  Will just watch for now.  Will do blood work and scans in May.  MEDICATIONS PRESCRIBED:  Refill for oxycodone/acetaminophen  INSTRUCTIONS GIVEN AND DISCUSSED: Report increased shortness of breath, increased fatique.  SPECIAL INSTRUCTIONS/FOLLOW-UP: After scans in May.  Thank you for choosing [facility] to provide your oncology and hematology care.  To afford each patient quality time with our providers, please arrive at least 15 minutes before your scheduled appointment time.  With your help, our goal is to use those 15 minutes to complete the necessary work-up to ensure our physicians have the information they need to help with your evaluation and healthcare recommendations.    Effective January 1st, 2014, we ask that you re-schedule your appointment with our physicians should you arrive 10 or more minutes late for your appointment.  We strive to give you quality time with our providers, and arriving late affects you and other patients whose appointments are after yours.    Again, thank you for choosing [facility].  Our hope is that these requests will decrease the amount of time that you wait before being seen by our physicians.       _____________________________________________________________  Should you have questions after your visit to [facility], please contact our office at [phone] between the hours of 8:30 a.m. and 5:00 p.m.  Voicemails left after 4:30 p.m. will not be returned until the following business day.  For prescription refill requests, have your pharmacy contact our office with your prescription  refill request.

## 2012-04-08 NOTE — Progress Notes (Signed)
Problem number 1 metastatic non-small cell lung cancer consistent with squamous cell carcinoma of the lung metastatic to brain status post radiation therapy to the brain. She had SRS to the brain. She is now status post 6 cycles of carboplatinum and paclitaxel with an excellent response. 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 The left hand is quite good except for possibly grade 1 neuropathy at best. She has grade 1 neuropathy of her feet as well. She is with her daughter, her son, and her son-in-law.  Her CAT scan shows tremendous improvement. She is reexpanded her left lower lobe as well. 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  We therefore are going to stop therapy. She wants to stop therapy as well. We will re\re x-ray her in 3-1/2 months. We will see her after that. We'll continue to flush her port. She wanted the port removed but I want her to keep that. We will check lab work and CAT scans in 3 months. We had a 30 minute discussion about her issues.

## 2012-04-09 ENCOUNTER — Other Ambulatory Visit (HOSPITAL_COMMUNITY): Payer: Medicare Other

## 2012-04-10 ENCOUNTER — Inpatient Hospital Stay (HOSPITAL_COMMUNITY): Payer: Medicare Other

## 2012-04-11 ENCOUNTER — Ambulatory Visit (HOSPITAL_COMMUNITY): Payer: Medicare Other

## 2012-04-24 ENCOUNTER — Other Ambulatory Visit (HOSPITAL_COMMUNITY): Payer: Self-pay | Admitting: Internal Medicine

## 2012-04-24 ENCOUNTER — Ambulatory Visit (HOSPITAL_COMMUNITY)
Admission: RE | Admit: 2012-04-24 | Discharge: 2012-04-24 | Disposition: A | Discharge status: Home / Self Care | Payer: Medicare Other | Source: Ambulatory Visit | Attending: Internal Medicine | Admitting: Internal Medicine

## 2012-04-24 DIAGNOSIS — R05 Cough: Secondary | ICD-10-CM | POA: Insufficient documentation

## 2012-04-24 DIAGNOSIS — R059 Cough, unspecified: Secondary | ICD-10-CM | POA: Insufficient documentation

## 2012-04-24 DIAGNOSIS — R0602 Shortness of breath: Secondary | ICD-10-CM | POA: Insufficient documentation

## 2012-04-24 DIAGNOSIS — J343 Hypertrophy of nasal turbinates: Secondary | ICD-10-CM

## 2012-05-01 ENCOUNTER — Encounter (HOSPITAL_COMMUNITY): Payer: Medicare Other

## 2012-05-06 ENCOUNTER — Telehealth (HOSPITAL_COMMUNITY): Payer: Self-pay | Admitting: Oncology

## 2012-05-06 ENCOUNTER — Other Ambulatory Visit (HOSPITAL_COMMUNITY): Payer: Self-pay | Admitting: Oncology

## 2012-05-06 DIAGNOSIS — C3490 Malignant neoplasm of unspecified part of unspecified bronchus or lung: Secondary | ICD-10-CM

## 2012-05-06 MED ORDER — OXYCODONE-ACETAMINOPHEN 5-325 MG PO TABS: 1.0000 | ORAL_TABLET | ORAL | Status: DC | PRN

## 2012-05-06 MED ORDER — LORAZEPAM 0.5 MG PO TABS: 0.5000 mg | ORAL_TABLET | Freq: Four times a day (QID) | ORAL | Status: DC | PRN

## 2012-05-06 NOTE — Telephone Encounter (Signed)
Needs refill for Lorazepam and Oxycodone, please.  [name] [phone], will pick-up.  She is completely out.

## 2012-05-07 ENCOUNTER — Encounter (HOSPITAL_COMMUNITY): Payer: Medicare Other | Attending: Oncology

## 2012-05-07 DIAGNOSIS — C7931 Secondary malignant neoplasm of brain: Secondary | ICD-10-CM | POA: Insufficient documentation

## 2012-05-07 DIAGNOSIS — C7949 Secondary malignant neoplasm of other parts of nervous system: Secondary | ICD-10-CM | POA: Insufficient documentation

## 2012-05-07 DIAGNOSIS — Z452 Encounter for adjustment and management of vascular access device: Secondary | ICD-10-CM

## 2012-05-07 MED ORDER — SODIUM CHLORIDE 0.9 % IJ SOLN
20.0000 mL | INTRAMUSCULAR | Status: DC | PRN
  Administered 2012-05-07: 20 mL via INTRAVENOUS
  Filled 2012-05-07: qty 20

## 2012-05-07 MED ORDER — HEPARIN SOD (PORK) LOCK FLUSH 100 UNIT/ML IV SOLN
INTRAVENOUS | Status: AC
  Filled 2012-05-07: qty 5

## 2012-05-07 MED ORDER — HEPARIN SOD (PORK) LOCK FLUSH 100 UNIT/ML IV SOLN
500.0000 [IU] | Freq: Once | INTRAVENOUS | Status: AC
  Administered 2012-05-07: 500 [IU] via INTRAVENOUS
  Filled 2012-05-07: qty 5

## 2012-05-07 NOTE — Progress Notes (Signed)
[name] presented for Portacath access and flush. Proper placement of portacath confirmed by CXR. Portacath located left chest wall accessed with  H 20 needle. Good blood return present. Portacath flushed with 20ml NS and 500U/79ml Heparin and needle removed intact. Procedure without incident. Patient tolerated procedure well.

## 2012-05-24 ENCOUNTER — Other Ambulatory Visit (HOSPITAL_COMMUNITY): Payer: Self-pay | Admitting: Oncology

## 2012-05-24 DIAGNOSIS — C3490 Malignant neoplasm of unspecified part of unspecified bronchus or lung: Secondary | ICD-10-CM

## 2012-05-24 MED ORDER — LORAZEPAM 1 MG PO TABS: 1.0000 mg | ORAL_TABLET | Freq: Four times a day (QID) | ORAL | Status: DC | PRN

## 2012-05-29 ENCOUNTER — Other Ambulatory Visit (HOSPITAL_COMMUNITY): Payer: Self-pay | Admitting: Oncology

## 2012-05-29 DIAGNOSIS — C3490 Malignant neoplasm of unspecified part of unspecified bronchus or lung: Secondary | ICD-10-CM

## 2012-05-29 MED ORDER — OXYCODONE-ACETAMINOPHEN 5-325 MG PO TABS: 1.0000 | ORAL_TABLET | ORAL | Status: DC | PRN

## 2012-06-26 ENCOUNTER — Other Ambulatory Visit: Payer: Self-pay | Admitting: Radiation Therapy

## 2012-06-26 DIAGNOSIS — C7949 Secondary malignant neoplasm of other parts of nervous system: Secondary | ICD-10-CM

## 2012-06-28 ENCOUNTER — Encounter: Payer: Self-pay | Admitting: Radiation Oncology

## 2012-06-28 ENCOUNTER — Ambulatory Visit
Admission: RE | Admit: 2012-06-28 | Discharge: 2012-06-28 | Disposition: A | Discharge status: Home / Self Care | Payer: Medicare Other | Source: Ambulatory Visit | Attending: Radiation Oncology | Admitting: Radiation Oncology

## 2012-06-28 DIAGNOSIS — C7931 Secondary malignant neoplasm of brain: Secondary | ICD-10-CM | POA: Insufficient documentation

## 2012-06-28 DIAGNOSIS — C7949 Secondary malignant neoplasm of other parts of nervous system: Secondary | ICD-10-CM

## 2012-06-28 DIAGNOSIS — C3490 Malignant neoplasm of unspecified part of unspecified bronchus or lung: Secondary | ICD-10-CM | POA: Insufficient documentation

## 2012-06-28 MED ORDER — GADOBENATE DIMEGLUMINE 529 MG/ML IV SOLN
9.0000 mL | Freq: Once | INTRAVENOUS | Status: AC | PRN
  Administered 2012-06-28: 9 mL via INTRAVENOUS

## 2012-07-01 ENCOUNTER — Encounter: Payer: Self-pay | Admitting: Radiation Oncology

## 2012-07-01 ENCOUNTER — Ambulatory Visit
Admission: RE | Admit: 2012-07-01 | Discharge: 2012-07-01 | Disposition: A | Discharge status: Home / Self Care | Payer: Medicare Other | Source: Ambulatory Visit | Attending: Radiation Oncology | Admitting: Radiation Oncology

## 2012-07-01 VITALS — BP 128/66 | HR 64 | Temp 97.4°F | Resp 20 | Wt 111.5 lb

## 2012-07-01 DIAGNOSIS — C7949 Secondary malignant neoplasm of other parts of nervous system: Secondary | ICD-10-CM

## 2012-07-01 NOTE — Progress Notes (Addendum)
Follow up Lung cancer/brain mets, s/p SRS left brain 12/15/11 Alert,oriented x3, in w/c, no c/o head aches, nausea,    Pain, in hands feet/has neuropathy in right hand and feet, new meds gabapentin 300mg  tid, vsion cahnges since cancer dx, MRI 06/28/12  Brain improvement, appt with DR. Kefalus in [place] 07/17/12 Appetite great has  gained 9 lbs  Past 3 weeks, eating tid

## 2012-07-01 NOTE — Progress Notes (Signed)
Radiation Oncology         [phone] ________________________________  Name: [name] MRN: [id number]  Date: 07/01/2012  DOB: [birth year]  Follow-Up Visit Note  Outpatient  CC: Colette Ribas, MD  Corrie Mckusick, MD  Diagnosis: Brain metastasis - left parietal lobe, lung squamous cell carcinoma primary   Interval Since Last Radiation: She completed stereotactic radiosurgery to the left parietal lobe lesion, 20 Gray in 1 fraction, on 12/15/2011  Narrative:  The patient returns today for routine follow-up.   On 06/28/12 MRI showed  continued improvement solitary left parietal metastasis. No new  lesions detected.  PET in January showed: 1. No evidence of residual hypermetabolic nodal metastasis in the mediastinum.  2. No evidence of skeletal metastasis. 3. Stable bilateral pulmonary nodules with mild metabolic activity. Recommend attention on follow-up.  Chemo stopped in end of January with f/u imaging planned to be done in 3 months.  Denies HA, nausea.  Main complaint - neopathy in hands / feet. Appetite excellent, gaining weight.                 ALLERGIES:  is allergic to sulfa antibiotics; neosporin; aspirin; and codeine.  Meds: Current Outpatient Prescriptions  Medication Sig Dispense Refill  . albuterol (PROVENTIL) (2.5 MG/3ML) 0.083% nebulizer solution Take 2.5 mg by nebulization every 4 (four) hours as needed. Shortness of Breath      . budesonide-formoterol (SYMBICORT) 160-4.5 MCG/ACT inhaler Inhale 2 puffs into the lungs 2 (two) times daily.      . Cholecalciferol (VITAMIN D PO) Take 2,000 Units by mouth daily.       . clopidogrel (PLAVIX) 75 MG tablet Take 75 mg by mouth every evening.       Marland Kitchen co-enzyme Q-10 50 MG capsule Take 50 mg by mouth daily.      Tery Sanfilippo Calcium (STOOL SOFTENER PO) Take 1 capsule by mouth every morning.      . fish oil-omega-3 fatty acids 1000 MG capsule Take 1 g by mouth every morning.      . gabapentin (NEURONTIN) 300 MG capsule  Take 300 mg by mouth 3 (three) times daily.      Marland Kitchen LORazepam (ATIVAN) 1 MG tablet Take 1 tablet (1 mg total) by mouth every 6 (six) hours as needed for anxiety.  30 tablet  1  . omeprazole (PRILOSEC) 20 MG capsule Take 20 mg by mouth 2 (two) times daily. Acid Reflux      . oxyCODONE-acetaminophen (PERCOCET/ROXICET) 5-325 MG per tablet Take 1 tablet by mouth every 4 (four) hours as needed for pain.  60 tablet  0  . polyethylene glycol powder (GLYCOLAX/MIRALAX) powder Take 17 g by mouth daily as needed. Constipation      . tiotropium (SPIRIVA) 18 MCG inhalation capsule Place 18 mcg into inhaler and inhale daily.      Marland Kitchen lidocaine-prilocaine (EMLA) cream Apply 1 application topically as needed. Apply a quarter sized amount 1 hour prior to chemo. Do not rub in. Cover with plastic.      . sucralfate (CARAFATE) 1 GM/10ML suspension Take 10 mLs (1 g total) by mouth 4 (four) times daily.  420 mL  1   No current facility-administered medications for this encounter.    Physical Findings: The patient is in no acute distress. Patient is alert and oriented.  weight is 111 lb 8 oz (50.576 kg). Her oral temperature is 97.4 F (36.3 C). Her blood pressure is 128/66 and her pulse is 64. Her  respiration is 20 and oxygen saturation is 100%. .  No significant changes. General: Alert and oriented, in no acute distress - in wheelchair. HEENT: Head is normocephalic. Pupils are equally round and reactive to light. Extraocular movements are intact. Oropharynx is clear. Heart: Regular in rate and rhythm with no murmurs, rubs, or gallops. Chest: Clear to auscultation bilaterally, with no rhonchi, wheezes, or rales. Extremities:contracture of right hand. Attributed to neuropathy.  Musculoskeletal: 4/5 symmetric strength throughout. Neurologic: Cranial nerves II through XII are grossly intact. No obvious focalities. Speech is fluent. Coordination is intact.  Psychiatric: Judgment and insight are intact. Affect is  appropriate.    Lab Findings: Lab Results  Component Value Date   WBC 19.1* 03/23/2012   HGB 10.2* 03/23/2012   HCT 30.2* 03/23/2012   MCV 93.8 03/23/2012   PLT 113* 03/23/2012   CMP     Component Value Date/Time   NA 134* 03/23/2012 0056   K 3.4* 03/23/2012 0056   CL 97 03/23/2012 0056   CO2 25 03/23/2012 0056   GLUCOSE 146* 03/23/2012 0056   BUN 18.5 06/28/2012 0950   BUN 18 03/23/2012 0056   CREATININE 0.9 06/28/2012 0950   CREATININE 0.80 03/23/2012 0056   CALCIUM 10.8* 03/23/2012 0056   PROT 7.1 03/19/2012 1014   ALBUMIN 3.9 03/19/2012 1014   AST 15 03/19/2012 1014   ALT 7 03/19/2012 1014   ALKPHOS 78 03/19/2012 1014   BILITOT 0.4 03/19/2012 1014   GFRNONAA 69* 03/23/2012 0056   GFRAA 80* 03/23/2012 0056     Radiographic Findings: Mr Laqueta Jean Wo Contrast  06/28/2012  *RADIOLOGY REPORT*  Clinical Data: Squamous cell carcinoma of the lung.  Status post S R S for left parietal metastasis  MRI HEAD WITHOUT AND WITH CONTRAST  Technique:  Multiplanar, multiecho pulse sequences of the brain and surrounding structures were obtained according to standard protocol without and with intravenous contrast  Contrast: 9mL MULTIHANCE GADOBENATE DIMEGLUMINE 529 MG/ML IV SOLN  Comparison: Most recent 03/22/2012.  Findings: There is improved left parietal metastasis compared with most recent priors cross-sectional measurements today are 3 x 3 mm, improved from 4 x 5 mm in January.  January scan showed significant improvement from pre-treatment scan of 12/08/2011. No new lesions are detected.  Moderate atrophy. No acute infarction or hemorrhage.  Mild chronic microvascular ischemic change.  Scattered lacunes/perivascular spaces.  Major flow voids are preserved.  Chronic sinus disease unchanged, most notably in the left greater than right sphenoid sinus.  Prior mastoidectomy, right.  No bone marrow lesions. Bilateral calvarial venous lakes, stable.  IMPRESSION: Continued improvement solitary left parietal metastasis.  No  new lesions detected.   Original Report Authenticated By: Davonna Belling, M.D.     Impression/Plan:  Doing well. No progressive or recurrent disease in brain. F/u in 3 mo with repeat MRI of brain. Told pt/family to call if questions arise before then.  I spent 20 minutes minutes face to face with the patient and more than 50% of that time was spent in counseling and/or coordination of care. _____________________________________   Lonie Peak, MD

## 2012-07-16 ENCOUNTER — Encounter (HOSPITAL_COMMUNITY): Payer: Self-pay

## 2012-07-16 ENCOUNTER — Ambulatory Visit (HOSPITAL_COMMUNITY)
Admission: RE | Admit: 2012-07-16 | Discharge: 2012-07-16 | Disposition: A | Discharge status: Home / Self Care | Payer: Medicare Other | Source: Ambulatory Visit | Attending: Oncology | Admitting: Oncology

## 2012-07-16 DIAGNOSIS — J479 Bronchiectasis, uncomplicated: Secondary | ICD-10-CM | POA: Insufficient documentation

## 2012-07-16 DIAGNOSIS — C7931 Secondary malignant neoplasm of brain: Secondary | ICD-10-CM | POA: Insufficient documentation

## 2012-07-16 DIAGNOSIS — Z923 Personal history of irradiation: Secondary | ICD-10-CM | POA: Insufficient documentation

## 2012-07-16 DIAGNOSIS — C3490 Malignant neoplasm of unspecified part of unspecified bronchus or lung: Secondary | ICD-10-CM | POA: Insufficient documentation

## 2012-07-16 DIAGNOSIS — Z9221 Personal history of antineoplastic chemotherapy: Secondary | ICD-10-CM | POA: Insufficient documentation

## 2012-07-16 MED ORDER — IOHEXOL 300 MG/ML  SOLN
80.0000 mL | Freq: Once | INTRAMUSCULAR | Status: AC | PRN
  Administered 2012-07-16: 80 mL via INTRAVENOUS

## 2012-07-17 ENCOUNTER — Encounter (HOSPITAL_COMMUNITY): Payer: Self-pay | Admitting: Oncology

## 2012-07-17 ENCOUNTER — Encounter (HOSPITAL_COMMUNITY): Payer: Medicare Other | Attending: Oncology | Admitting: Oncology

## 2012-07-17 VITALS — BP 126/64 | HR 66 | Temp 97.9°F | Resp 18 | Wt 114.0 lb

## 2012-07-17 DIAGNOSIS — C3430 Malignant neoplasm of lower lobe, unspecified bronchus or lung: Secondary | ICD-10-CM

## 2012-07-17 DIAGNOSIS — C3490 Malignant neoplasm of unspecified part of unspecified bronchus or lung: Secondary | ICD-10-CM

## 2012-07-17 DIAGNOSIS — C7931 Secondary malignant neoplasm of brain: Secondary | ICD-10-CM | POA: Insufficient documentation

## 2012-07-17 DIAGNOSIS — G609 Hereditary and idiopathic neuropathy, unspecified: Secondary | ICD-10-CM

## 2012-07-17 DIAGNOSIS — C7949 Secondary malignant neoplasm of other parts of nervous system: Secondary | ICD-10-CM | POA: Insufficient documentation

## 2012-07-17 NOTE — Patient Instructions (Addendum)
[facility] Discharge Instructions  RECOMMENDATIONS MADE BY THE CONSULTANT AND ANY TEST RESULTS WILL BE SENT TO YOUR REFERRING PHYSICIAN.  MEDICATIONS PRESCRIBED:  none  INSTRUCTIONS GIVEN AND DISCUSSED: CT scan reviewed.  Looks very good with some post-radiation lung changes on the left.  SPECIAL INSTRUCTIONS/FOLLOW-UP: Labs in 4 months followed by a repeat CT of chest in 4 months Return in 4 months  Thank you for choosing [facility] to provide your oncology and hematology care.  To afford each patient quality time with our providers, please arrive at least 15 minutes before your scheduled appointment time.  With your help, our goal is to use those 15 minutes to complete the necessary work-up to ensure our physicians have the information they need to help with your evaluation and healthcare recommendations.    Effective January 1st, 2014, we ask that you re-schedule your appointment with our physicians should you arrive 10 or more minutes late for your appointment.  We strive to give you quality time with our providers, and arriving late affects you and other patients whose appointments are after yours.    Again, thank you for choosing [facility].  Our hope is that these requests will decrease the amount of time that you wait before being seen by our physicians.       _____________________________________________________________  Should you have questions after your visit to [facility], please contact our office at [phone] between the hours of 8:30 a.m. and 5:00 p.m.  Voicemails left after 4:30 p.m. will not be returned until the following business day.  For prescription refill requests, have your pharmacy contact our office with your prescription refill request.

## 2012-07-17 NOTE — Progress Notes (Signed)
Jennifer Ribas, MD [address] [place] [address]  Squamous cell carcinoma lung, unspecified laterality - Plan: b complex vitamins capsule, CT Chest W Contrast, CBC with Differential, Comprehensive metabolic panel  Single Brain Metastasis - Plan: b complex vitamins capsule, CT Chest W Contrast, CBC with Differential, Comprehensive metabolic panel  CURRENT THERAPY: Surveillance  INTERVAL HISTORY: [name] 79 y.o. 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 She had SRS to the brain completed on 12/15/2011 by Dr. Basilio Cairo.  She also had radiation to left lung from 12/07/2011- 12/21/2011.  She is now status post 6 cycles of carboplatin and paclitaxel (12/28/2011- 03/20/2012) with an excellent response.   [name] is here to review her most recent CT scan. Her CT scan on 07/16/12 of chest shows: New area of airspace consolidation and bronchiectasis in the  left lung, predominately involving the superior segment of the left  lower lobe, as detailed above. The appearance of this area would  be compatible with an area of evolving postradiation change,  however, in the absence of any history of radiation therapy to this  region this is presumably infectious etiology. Alternatively, and  less favored, this could represent an unusual pattern of  dissemination of tumor (i.e., an unusual appearance of lymphangitic  spread of disease), given the prior location of the left lower lobe  squamous cell carcinoma. There is also a small amount of new  pleural fluid and/or thickening adjacent to this area.  When chart is reviewed, the patient did have radiation to the left side by Dr. Thersa Salt in [place], [place] as part of her treatment.  She completed that in October 2013.   With this information in mind, these changes in her lung  are likely indicative of post-radiation changes.   She continues to report peripheral neuropathy, worse in right hand and B/L LE peripheral neuropathy.  She reports that it is improving.   Oncologically, she denies any complaints and ROS questioning is negative.    Past Medical History  Diagnosis Date  . COPD (chronic obstructive pulmonary disease)   . Peripheral vascular disease   . Hilar mass     left lung  . Hypothyroidism   . S/P radiation therapy lung    35 years ago at [facility] per patient report  . S/P radiation therapy 12/25/11 Brain    Left Parietal Lobe/ 20 Gray/ 1 Fraction  . Status post chemotherapy Complete    6 cycles of Carboplatinum/ Alimta  . Peripheral neuropathy noted 04/08/12    Right Hand grade 2 0r 3 / Left hand grade 1/ Feet grade 1  . Unilateral complete paralysis of vocal cord     LEFT  . Cancer     breast  . Breast cancer     rt  . Squamous cell carcinoma lung     breast     has COPD (chronic obstructive pulmonary disease); Abdominal pain, acute; Collapsed lung; Squamous cell carcinoma lung; and Single Brain Metastasis on her problem list.     is allergic to sulfa antibiotics; neosporin; aspirin; and codeine.  Ms. Wingert had no medications administered during this visit.  Past Surgical History  Procedure Laterality Date  . Mastectomy      rt side  . Abdominal hysterectomy    . Flexible bronchoscopy  11/09/2011  Procedure: FLEXIBLE BRONCHOSCOPY;  Surgeon: Fredirick Maudlin, MD;  Location: [facility];  Service: Pulmonary;  Laterality: Left;  . Thyroidectomy    . External ear surgery    . Neck mass excision      1960's  . Femoral artery stent  2009  . Portacath placement  12/26/2011    Procedure: INSERTION PORT-A-CATH;  Surgeon: Marlane Hatcher, MD;  Location: [facility];  Service: General;  Laterality: Left;  left subclavian    Denies any headaches, dizziness, double vision, fevers, chills, night sweats, nausea, vomiting, diarrhea, constipation,  chest pain, heart palpitations, shortness of breath, blood in stool, black tarry stool, urinary pain, urinary burning, urinary frequency, hematuria.   PHYSICAL EXAMINATION  ECOG PERFORMANCE STATUS: 1 - Symptomatic but completely ambulatory  Filed Vitals:   07/17/12 1000  BP: 126/64  Pulse: 66  Temp: 97.9 F (36.6 C)  Resp: 18    GENERAL:alert, no distress, well nourished, well developed, comfortable, cooperative and smiling, voice is less hoarse. SKIN: skin color, texture, turgor are normal, no rashes or significant lesions HEAD: Normocephalic, No masses, lesions, tenderness or abnormalities EYES: normal, Conjunctiva are pink and non-injected EARS: External ears normal OROPHARYNX:mucous membranes are moist  NECK: supple, trachea midline, right anterior neck/supraclavicular mass measuring 3 cm round that is tender to palpation and is chronic without changes. LYMPH:  no palpable lymphadenopathy, no hepatosplenomegaly BREAST:not examined LUNGS: clear to auscultation and percussion HEART: regular rate & rhythm, no murmurs, no gallops, S1 normal and S2 normal ABDOMEN:abdomen soft, non-tender, normal bowel sounds, no masses or organomegaly and no hepatosplenomegaly BACK: Back symmetric, no curvature., No CVA tenderness EXTREMITIES:less then 2 second capillary refill, no joint deformities, effusion, or inflammation, no edema, no skin discoloration, no clubbing, no cyanosis, positive findings:  Right hand atrophy  NEURO: alert & oriented x 3 with fluent speech, no focal motor/sensory deficits, gait normal   LABORATORY DATA: CBC    Component Value Date/Time   WBC 19.1* 03/23/2012 0056   RBC 3.22* 03/23/2012 0056   HGB 10.2* 03/23/2012 0056   HCT 30.2* 03/23/2012 0056   PLT 113* 03/23/2012 0056   MCV 93.8 03/23/2012 0056   MCH 31.7 03/23/2012 0056   MCHC 33.8 03/23/2012 0056   RDW 17.7* 03/23/2012 0056   LYMPHSABS 0.2* 03/23/2012 0056   MONOABS 0.3 03/23/2012 0056   EOSABS 0.0 03/23/2012  0056   BASOSABS 0.0 03/23/2012 0056      Chemistry      Component Value Date/Time   NA 134* 03/23/2012 0056   K 3.4* 03/23/2012 0056   CL 97 03/23/2012 0056   CO2 25 03/23/2012 0056   BUN 18.5 06/28/2012 0950   BUN 18 03/23/2012 0056   CREATININE 0.9 06/28/2012 0950   CREATININE 0.80 03/23/2012 0056      Component Value Date/Time   CALCIUM 10.8* 03/23/2012 0056   ALKPHOS 78 03/19/2012 1014   AST 15 03/19/2012 1014   ALT 7 03/19/2012 1014   BILITOT 0.4 03/19/2012 1014       RADIOGRAPHIC STUDIES:  Ct Chest W Contrast  07/16/2012  *RADIOLOGY REPORT*  Clinical Data: Right-sided breast cancer status post mastectomy, chemotherapy and radiation therapy.  Squamous cell carcinoma of the lung with brain metastases.  CT CHEST WITH CONTRAST  Technique:  Multidetector CT imaging of the chest was performed following the standard protocol during bolus administration of intravenous contrast.  Contrast: 80mL OMNIPAQUE IOHEXOL 300 MG/ML  SOLN  Comparison: PET CT 04/04/2012.  Findings:  Mediastinum: Heart size  is normal. There is no significant pericardial fluid, thickening or pericardial calcification. There is atherosclerosis of the thoracic aorta, the great vessels of the mediastinum and the coronary arteries, including calcified atherosclerotic plaque in the left main, left anterior descending, left circumflex and right coronary arteries. No pathologically enlarged mediastinal or hilar lymph nodes. There is a small hiatal hernia. Left-sided subclavian Port-A-Cath with tip terminating in the distal superior vena cava.  Lungs/Pleura: There is a new area of consolidation and cylindrical/varicose bronchiectasis in the perihilar aspect of the left lung, predominately involving the superior segment of the left lower lobe with lesser involvement of the inferior aspect of the left upper lobe.  The appearance of this area is unusual, and is most suggestive of an area of evolving postradiation change, likely with developing  fibrosis, however, review of the medical records demonstrates no history of radiation therapy to this region, so these findings are presumably post infectious. There is a small amount of pleural fluid and/or pleural thickening overlying the new area of airspace consolidation in the left lung.  Throughout the lungs bilaterally there is a pattern of subtle subpleural reticulation, and there is evidence of early honeycombing in the lower lobes.  Findings are suggestive of an interstitial lung disease, given the presence of some mild honeycombing and basilar predominance, this is favored to represent a usual interstitial pneumonia (UIP) pattern.  An 11 x 8 mm spiculated nodular density in the apex of the right upper lobe (image 12 of series 3) is completely unchanged compared to prior examinations when measured in a similar fashion, favored to represent an area of chronic scarring.  Upper Abdomen: Atherosclerosis.  Musculoskeletal: There are no aggressive appearing lytic or blastic lesions noted in the visualized portions of the skeleton.  Status post right modified radical mastectomy and axillary nodal dissection.  IMPRESSION: 1.  New area of airspace consolidation and bronchiectasis in the left lung, predominately involving the superior segment of the left lower lobe, as detailed above.  The appearance of this area would be compatible with an area of evolving postradiation change, however, in the absence of any history of radiation therapy to this region this is presumably infectious etiology.  Alternatively, and less favored, this could represent an unusual pattern of dissemination of tumor (i.e., an unusual appearance of lymphangitic spread of disease), given the prior location of the left lower lobe squamous cell carcinoma. There is also a small amount of new pleural fluid and/or thickening adjacent to this area. 2.  The appearance of the lung parenchyma is suggestive of a background of interstitial lung disease,  favored to represent a UIP pattern. 3. Atherosclerosis, including left main and three-vessel coronary artery disease. 4.  Small hiatal hernia.  These results were called by telephone on 07/16/2012 at 12:15 p.m. to Dr. Mariel Sleet, who verbally acknowledged these results.   Original Report Authenticated By: Trudie Reed, M.D.       ASSESSMENT:  1. 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 She had SRS to the brain. She is now status post 6 cycles of carboplatinum and paclitaxel with an excellent response. 2. Grade 2-3 peripheral neuropathy of right hand 3. Grade 1 peripheral neuropathy of left hand 4. Grade 1 peripheral neuropathy of feet B/L 5. History of right hand carpal tunnel  Patient Active Problem List   Diagnosis Date Noted  . Single Brain Metastasis 12/11/2011  . Abdominal pain, acute  10/30/2011  . Collapsed lung 10/30/2011  . COPD (chronic obstructive pulmonary disease)   . Squamous cell carcinoma lung     PLAN:  1. I personally reviewed and went over laboratory results with the patient. 2. I personally reviewed and went over radiographic studies with the patient. 3. CT scan of chest with contrast in 4 months 4. Labs in 4 months: CBC diff, CMET 5. Patient education regarding peripheral neuropathy 6. Return in 4 months for follow-up.   All questions were answered. The patient knows to call the clinic with any problems, questions or concerns. We can certainly see the patient much sooner if necessary.  The patient and plan discussed with Glenford Peers, MD and he is in agreement with the aforementioned.  KEFALAS,THOMAS

## 2012-07-30 ENCOUNTER — Other Ambulatory Visit: Payer: Self-pay | Admitting: Radiation Therapy

## 2012-07-30 DIAGNOSIS — C7949 Secondary malignant neoplasm of other parts of nervous system: Secondary | ICD-10-CM

## 2012-09-11 ENCOUNTER — Other Ambulatory Visit: Payer: Self-pay | Admitting: *Deleted

## 2012-09-11 MED ORDER — CLOPIDOGREL BISULFATE 75 MG PO TABS: 75.0000 mg | ORAL_TABLET | Freq: Every evening | ORAL | Status: DC

## 2012-09-11 NOTE — Telephone Encounter (Signed)
Refill authorized for plavix

## 2012-09-25 ENCOUNTER — Other Ambulatory Visit: Payer: Self-pay | Admitting: Radiation Therapy

## 2012-09-25 DIAGNOSIS — C7931 Secondary malignant neoplasm of brain: Secondary | ICD-10-CM

## 2012-09-30 ENCOUNTER — Ambulatory Visit
Admission: RE | Admit: 2012-09-30 | Discharge: 2012-09-30 | Disposition: A | Discharge status: Home / Self Care | Payer: Medicare Other | Source: Ambulatory Visit | Attending: Radiation Oncology | Admitting: Radiation Oncology

## 2012-09-30 DIAGNOSIS — C7949 Secondary malignant neoplasm of other parts of nervous system: Secondary | ICD-10-CM | POA: Insufficient documentation

## 2012-09-30 DIAGNOSIS — C7931 Secondary malignant neoplasm of brain: Secondary | ICD-10-CM | POA: Insufficient documentation

## 2012-09-30 DIAGNOSIS — C801 Malignant (primary) neoplasm, unspecified: Secondary | ICD-10-CM | POA: Insufficient documentation

## 2012-09-30 LAB — BUN AND CREATININE (CC13)
BUN: 15.2 mg/dL (ref 7.0–26.0)
Creatinine: 0.9 mg/dL (ref 0.6–1.1)

## 2012-10-01 ENCOUNTER — Ambulatory Visit
Admission: RE | Admit: 2012-10-01 | Discharge: 2012-10-01 | Disposition: A | Discharge status: Home / Self Care | Payer: Medicare Other | Source: Ambulatory Visit | Attending: Radiation Oncology | Admitting: Radiation Oncology

## 2012-10-01 DIAGNOSIS — C7931 Secondary malignant neoplasm of brain: Secondary | ICD-10-CM

## 2012-10-01 MED ORDER — GADOBENATE DIMEGLUMINE 529 MG/ML IV SOLN
11.0000 mL | Freq: Once | INTRAVENOUS | Status: AC | PRN
  Administered 2012-10-01: 11 mL via INTRAVENOUS

## 2012-10-14 ENCOUNTER — Other Ambulatory Visit: Payer: Self-pay | Admitting: Cardiology

## 2012-10-14 NOTE — Telephone Encounter (Signed)
Rx was sent to pharmacy electronically. 

## 2012-11-14 ENCOUNTER — Other Ambulatory Visit: Payer: Self-pay | Admitting: Radiation Therapy

## 2012-11-14 DIAGNOSIS — C7931 Secondary malignant neoplasm of brain: Secondary | ICD-10-CM

## 2012-11-15 ENCOUNTER — Encounter (HOSPITAL_COMMUNITY): Payer: Medicare Other | Attending: Hematology and Oncology

## 2012-11-15 ENCOUNTER — Telehealth (HOSPITAL_COMMUNITY): Payer: Self-pay | Admitting: *Deleted

## 2012-11-15 DIAGNOSIS — Z09 Encounter for follow-up examination after completed treatment for conditions other than malignant neoplasm: Secondary | ICD-10-CM | POA: Insufficient documentation

## 2012-11-15 DIAGNOSIS — C3430 Malignant neoplasm of lower lobe, unspecified bronchus or lung: Secondary | ICD-10-CM

## 2012-11-15 DIAGNOSIS — C7931 Secondary malignant neoplasm of brain: Secondary | ICD-10-CM

## 2012-11-15 DIAGNOSIS — Z85841 Personal history of malignant neoplasm of brain: Secondary | ICD-10-CM | POA: Insufficient documentation

## 2012-11-15 DIAGNOSIS — C3490 Malignant neoplasm of unspecified part of unspecified bronchus or lung: Secondary | ICD-10-CM

## 2012-11-15 DIAGNOSIS — Z85118 Personal history of other malignant neoplasm of bronchus and lung: Secondary | ICD-10-CM | POA: Insufficient documentation

## 2012-11-15 LAB — CBC WITH DIFFERENTIAL/PLATELET
Basophils Relative: 0 % (ref 0–1)
Eosinophils Absolute: 0.1 10*3/uL (ref 0.0–0.7)
Hemoglobin: 12.7 g/dL (ref 12.0–15.0)
MCH: 31.8 pg (ref 26.0–34.0)
MCHC: 33.6 g/dL (ref 30.0–36.0)
Monocytes Relative: 9 % (ref 3–12)
Neutro Abs: 4.7 10*3/uL (ref 1.7–7.7)
Neutrophils Relative %: 71 % (ref 43–77)
Platelets: 192 10*3/uL (ref 150–400)
RBC: 3.99 MIL/uL (ref 3.87–5.11)

## 2012-11-15 LAB — COMPREHENSIVE METABOLIC PANEL
ALT: 17 U/L (ref 0–35)
AST: 38 U/L — ABNORMAL HIGH (ref 0–37)
Albumin: 4.1 g/dL (ref 3.5–5.2)
Alkaline Phosphatase: 60 U/L (ref 39–117)
BUN: 12 mg/dL (ref 6–23)
Chloride: 94 mEq/L — ABNORMAL LOW (ref 96–112)
Potassium: 4.2 mEq/L (ref 3.5–5.1)
Sodium: 131 mEq/L — ABNORMAL LOW (ref 135–145)
Total Bilirubin: 0.6 mg/dL (ref 0.3–1.2)
Total Protein: 7.5 g/dL (ref 6.0–8.3)

## 2012-11-15 NOTE — Telephone Encounter (Signed)
Patient was here today for labs. She is due CT chest on Monday. Patient has lump on lt side of neck and also submandibular lump. Patient and son asked if scans should be added to include neck to look at this.

## 2012-11-15 NOTE — Telephone Encounter (Signed)
I can't say without a clinical exam. Is she wants to come for evluation next week before the scan that will be fine. The scan can be postponed. If I include the neck, her insurance may deny it and I can defend it on appeal without clinical data or solid indication.  Thanks.

## 2012-11-15 NOTE — Progress Notes (Signed)
Labs drawn today for cbc/diff,cmp 

## 2012-11-18 ENCOUNTER — Encounter (HOSPITAL_BASED_OUTPATIENT_CLINIC_OR_DEPARTMENT_OTHER): Payer: Medicare Other

## 2012-11-18 ENCOUNTER — Ambulatory Visit (HOSPITAL_COMMUNITY)
Admission: RE | Admit: 2012-11-18 | Discharge: 2012-11-18 | Disposition: A | Discharge status: Home / Self Care | Payer: Medicare Other | Source: Ambulatory Visit | Attending: Oncology | Admitting: Oncology

## 2012-11-18 VITALS — BP 105/68 | HR 68 | Temp 97.9°F | Resp 16 | Wt 122.0 lb

## 2012-11-18 DIAGNOSIS — Z85118 Personal history of other malignant neoplasm of bronchus and lung: Secondary | ICD-10-CM

## 2012-11-18 DIAGNOSIS — J479 Bronchiectasis, uncomplicated: Secondary | ICD-10-CM | POA: Insufficient documentation

## 2012-11-18 DIAGNOSIS — C3490 Malignant neoplasm of unspecified part of unspecified bronchus or lung: Secondary | ICD-10-CM

## 2012-11-18 DIAGNOSIS — Z923 Personal history of irradiation: Secondary | ICD-10-CM | POA: Insufficient documentation

## 2012-11-18 DIAGNOSIS — C7931 Secondary malignant neoplasm of brain: Secondary | ICD-10-CM

## 2012-11-18 DIAGNOSIS — R599 Enlarged lymph nodes, unspecified: Secondary | ICD-10-CM

## 2012-11-18 MED ORDER — IOHEXOL 300 MG/ML  SOLN
80.0000 mL | Freq: Once | INTRAMUSCULAR | Status: AC | PRN
  Administered 2012-11-18: 80 mL via INTRAVENOUS

## 2012-11-18 NOTE — Progress Notes (Signed)
[facility] [phone]   [phone]  OFFICE PROGRESS NOTE  Colette Ribas, MD [address] [place] [address]  DIAGNOSIS: Squamous cell carcinoma of the lung we did metastasis.   INTERVAL HISTORY:   Ms. [name] is a 79 year old woman was diagnosed with squamous cell carcinoma of the lung on 11/09/2011.  She was noted to have evidence of solitary left parietal lobe brain metastasis that was treated with stereotactic radiosurgery  to the brain on 12/15/2011( in [place]) .  She also did have radiation to the lung she was complicated in Jan 22 2012 according [facility]).  She was given 5 cycles of carboplatin/Paclitaxel completed on 03/21/2012. She is believed to have had excellent response.  Patient developed grade 1- 2 neuropathy reportedly from this and has wasting of the right thenar muscles( not clear if this is was chemo neuropathy). She was scheduled for an interval CT scan this week but patient's son had called me last week reporting that patient has a new neck mass and he would like to include neck CT at the next scheduled scan.  I have asked the patient coming today for proper evaluation prior to ordering the scan. Patient the reports left supraclavicular lymph node which has been going on for the past the 4-6 weeks.  This is nontender but it is reportedly associated with some discomfort.  She has chronic cough no worsening shortness of breath.  Fatigue persists and patient has a performance status of about ECOG 3.  She is otherwise eating well denies weight loss and no real pain.    REVIEW OF SYSTEMS: 14 point review of system is as in the history above otherwise negative.  PHYSICAL EXAMINATION:  Blood pressure 105/68, pulse 68, temperature 97.9 F (36.6 C), temperature source Oral, resp. rate 16, weight 122 lb (55.339 kg). GENERAL: No acute distress. SKIN:  No rashes or  significant lesions . No ecchymosis or petechial rash. HEAD: Normocephalic, No masses, lesions, tenderness or abnormalities  EYES: Conjunctiva are pink and non-injected and no jaundice.Puplis equal and reactive on both sides ENT: External ears normal ,lips, buccal mucosa, and tongue normal and mucous membranes are moist . No evidence of thrush. LYMPH: No palpable lymphadenopathy, in the neck,or axilla. Bilateral supraclavicular masses, right is tender and the left is bigger 2-3 cm and non tender LUNGS: decreased breath sound bilateral otherwise no significant crackles wheezes. HEART: regular rate & rhythm, no murmurs, no gallops, S1 normal and S2 normal and no S3. ABDOMEN: Abdomen soft, non-tender, no masses or organomegaly and no hepatosplenomegaly palpable EXTREMITIES: No edema, no skin discoloration or tenderness NEURO: Alert & oriented , no focal motor  Deficits. Patient has wasting of the thenar muscles on the right hand and right upper extremity weakness.     LABORATORY DATA: Lab Results  Component Value Date   WBC 6.6 11/15/2012   HGB 12.7 11/15/2012   HCT 37.8 11/15/2012   MCV 94.7 11/15/2012   PLT 192 11/15/2012      Chemistry      Component Value Date/Time   NA 131* 11/15/2012 1002   K 4.2 11/15/2012 1002   CL 94* 11/15/2012 1002   CO2 28 11/15/2012 1002   BUN 12 11/15/2012 1002   BUN 15.2 09/30/2012 0929   CREATININE 1.08 11/15/2012 1002   CREATININE 0.9 09/30/2012 0929      Component Value Date/Time   CALCIUM  10.8* 11/15/2012 1002   ALKPHOS 60 11/15/2012 1002   AST 38* 11/15/2012 1002   ALT 17 11/15/2012 1002   BILITOT 0.6 11/15/2012 1002       RADIOGRAPHIC STUDIES: No results found.   ASSESSMENT:  Left supraclavicular LN suspicious for recurrence of  lung cancer. Patient does not want any more treatment but will like to do the scheduled CT scan. Patient's right upper extremity weakness is most likely from possible brachial plexus injury.  This is evidenced by wasting of the thenar  muscles. I had a very long discussion with the patient and her son. Patient did express some reluctance about accepting any treatment if she is confirmed to have metastatic care disease or recurrence.  I advised that if she is unwilling to accept any treatment, then it would be meaningless to do the CT scans this is not going to be acted upon . She was persuaded by her son to accept the CT scan as planned.  I had also discussed that if patient desires treatment,FNA of the left supraclavicular lymph node will be very reasonable to do.  PLAN:  1. CT scans as planned 2. RTC in 1 week 3. Patient declined biopsy of LN at this time. 4. CBC/CMP at the next visit.    All questions were satisfactorily answered. Patient knows to call if  any concern arises.  I spent more than 50 % counseling the patient face to face. The total time spent in the appointment was 30 minutes.   Sherral Hammers, MD FACP. Hematology/Oncology.   Addendum:  CT of the chest dated 11/18/2012; IMPRESSION:  Left neck base mass has enlarged when compared the prior exam  consistent with a metastatic lymph node. It measures 3.6 cm in  greatest transverse dimension.  No other change from the prior study.  Left mid lung opacity with associated bronchiectasis is consistent  with post radiation fibrosis. Triangular shaped focal opacity at  the right apex is stable and most likely scarring. Other areas of  interstitial fibrosis are noted bilaterally and peripherally, that  are also stable.  This is consistent with recurrent  lung cancer.  Follow up as scheduled.

## 2012-11-18 NOTE — Patient Instructions (Signed)
[facility] Discharge Instructions  RECOMMENDATIONS MADE BY THE CONSULTANT AND ANY TEST RESULTS WILL BE SENT TO YOUR REFERRING PHYSICIAN.  EXAM FINDINGS BY THE PHYSICIAN TODAY AND SIGNS OR SYMPTOMS TO REPORT TO CLINIC OR PRIMARY PHYSICIAN: Exam findings as discussed by Dr. Sharia Reeve.  SPECIAL INSTRUCTIONS/FOLLOW-UP: 1.  CT as scheduled today. 2.  Return in 1 week as scheduled to review CT results.  Thank you for choosing [name] to provide your oncology and hematology care.  To afford each patient quality time with our providers, please arrive at least 15 minutes before your scheduled appointment time.  With your help, our goal is to use those 15 minutes to complete the necessary work-up to ensure our physicians have the information they need to help with your evaluation and healthcare recommendations.    Effective January 1st, 2014, we ask that you re-schedule your appointment with our physicians should you arrive 10 or more minutes late for your appointment.  We strive to give you quality time with our providers, and arriving late affects you and other patients whose appointments are after yours.    Again, thank you for choosing [facility].  Our hope is that these requests will decrease the amount of time that you wait before being seen by our physicians.       _____________________________________________________________  Should you have questions after your visit to [facility], please contact our office at [phone] between the hours of 8:30 a.m. and 5:00 p.m.  Voicemails left after 4:30 p.m. will not be returned until the following business day.  For prescription refill requests, have your pharmacy contact our office with your prescription refill request.

## 2012-11-20 ENCOUNTER — Ambulatory Visit (HOSPITAL_COMMUNITY): Payer: Medicare Other

## 2012-11-21 ENCOUNTER — Telehealth: Payer: Self-pay | Admitting: Cardiovascular Disease

## 2012-11-21 MED ORDER — CLOPIDOGREL BISULFATE 75 MG PO TABS: 75.0000 mg | ORAL_TABLET | Freq: Every day | ORAL | Status: AC

## 2012-11-21 MED ORDER — CLOPIDOGREL BISULFATE 75 MG PO TABS: 75.0000 mg | ORAL_TABLET | Freq: Every day | ORAL | Status: DC

## 2012-11-21 NOTE — Telephone Encounter (Signed)
Harriett Sine, pt's daughter, called back.  Informed message receive and appt needed as stated below.  Verbalized understanding.  Appt scheduled for 10.16.14.  Refill given for clopidogrel #35 to last until appt.

## 2012-11-21 NOTE — Telephone Encounter (Signed)
Please call-need to talk to you about getting her medicine until she is able to get here for an appt.

## 2012-11-21 NOTE — Telephone Encounter (Signed)
Returned call.  Left message to call back before 4pm.  

## 2012-11-21 NOTE — Telephone Encounter (Signed)
Pt needs an appt scheduled before any refills will be given.  Last refill (8.4.14) instructed to call office for appt.  No appt scheduled.  If pt does not keep appt refills will not be able to be given until pt seen.  Last OV June 2013.

## 2012-11-25 ENCOUNTER — Encounter (HOSPITAL_BASED_OUTPATIENT_CLINIC_OR_DEPARTMENT_OTHER): Payer: Medicare Other

## 2012-11-25 ENCOUNTER — Other Ambulatory Visit (HOSPITAL_COMMUNITY): Payer: Medicare Other

## 2012-11-25 ENCOUNTER — Ambulatory Visit (HOSPITAL_COMMUNITY): Payer: Medicare Other | Admitting: Oncology

## 2012-11-25 ENCOUNTER — Encounter (HOSPITAL_COMMUNITY): Payer: Self-pay

## 2012-11-25 VITALS — Wt 125.6 lb

## 2012-11-25 DIAGNOSIS — C7931 Secondary malignant neoplasm of brain: Secondary | ICD-10-CM

## 2012-11-25 DIAGNOSIS — C3490 Malignant neoplasm of unspecified part of unspecified bronchus or lung: Secondary | ICD-10-CM

## 2012-11-25 DIAGNOSIS — C3492 Malignant neoplasm of unspecified part of left bronchus or lung: Secondary | ICD-10-CM

## 2012-11-25 LAB — CBC WITH DIFFERENTIAL/PLATELET
Basophils Absolute: 0 10*3/uL (ref 0.0–0.1)
Basophils Relative: 1 % (ref 0–1)
Eosinophils Absolute: 0.1 10*3/uL (ref 0.0–0.7)
Eosinophils Relative: 2 % (ref 0–5)
HCT: 36.2 % (ref 36.0–46.0)
Lymphocytes Relative: 24 % (ref 12–46)
MCH: 31.2 pg (ref 26.0–34.0)
MCHC: 32 g/dL (ref 30.0–36.0)
MCV: 97.3 fL (ref 78.0–100.0)
Monocytes Absolute: 0.5 10*3/uL (ref 0.1–1.0)
Platelets: 185 10*3/uL (ref 150–400)
RDW: 14.7 % (ref 11.5–15.5)
WBC: 5 10*3/uL (ref 4.0–10.5)

## 2012-11-25 LAB — COMPREHENSIVE METABOLIC PANEL
ALT: 15 U/L (ref 0–35)
AST: 30 U/L (ref 0–37)
CO2: 29 mEq/L (ref 19–32)
Calcium: 10.5 mg/dL (ref 8.4–10.5)
Creatinine, Ser: 0.96 mg/dL (ref 0.50–1.10)
GFR calc Af Amer: 64 mL/min — ABNORMAL LOW (ref >90)
GFR calc non Af Amer: 56 mL/min — ABNORMAL LOW (ref >90)
Sodium: 136 mEq/L (ref 135–145)
Total Protein: 7.1 g/dL (ref 6.0–8.3)

## 2012-11-25 LAB — APTT: aPTT: 31 seconds (ref 24–37)

## 2012-11-25 LAB — PROTIME-INR: INR: 0.97 (ref 0.00–1.49)

## 2012-11-25 NOTE — Addendum Note (Signed)
Addended by: Evelena Leyden on: 11/25/2012 12:35 PM   Modules accepted: Orders

## 2012-11-25 NOTE — Progress Notes (Signed)
[facility] OFFICE PROGRESS NOTE  PCP: Colette Ribas, MD [address] [place] [address]  DIAGNOSIS: SQUAMOUS CANCER LUNG, STAGE 4, PRIOR SOLITARY BRAIN METASTASIS  CURRENT THERAPY: NONE. PRIOR TX STAEREOTACTIC XRT FOR SOLITARY BRAIN METASTASIS 10/2011, THEN TAXOL/CARBO X 5 CYCLES COMPLETED 03/21/12  INTERVAL HISTORY: [name] 79 y.o. female returns for F/U AND RESTAGING, HAD RECENT NECK MASS, CT HAS BEEN DONE,  SHOWS 3.6 CM NECK MASS, CONSISTENT WITH RECURRENT CANCER, ALSO STABLE AREAS OF PULMONARY FIBROSIS. SEE ALSO OFFICE NOTE 11/18/12. CURRENTLY HAS SOME INCREASING NON PRODUCTIVE COUGH . ALSO HAS HAD PAIN IN NECK AND SHOULDER, RELIEVED BY PRN PAIN MEDS, DID NOT TAKE TODAY AS IT MAKES HER SLEEPY. NO FEVER, CHILLS, ABDO PAIN, NAUSEA  MEDICAL HISTORY: COPD, BREAST CANCER   SURGICAL HISTORY:  Past Surgical History  Procedure Laterality Date  . Mastectomy      rt side  . Abdominal hysterectomy    . Flexible bronchoscopy  11/09/2011    Procedure: FLEXIBLE BRONCHOSCOPY;  Surgeon: Fredirick Maudlin, MD;  Location: [facility];  Service: Pulmonary;  Laterality: Left;  . Thyroidectomy    . External ear surgery    . Neck mass excision      1960's  . Femoral artery stent  2009  . Portacath placement  12/26/2011    Procedure: INSERTION PORT-A-CATH;  Surgeon: Marlane Hatcher, MD;  Location: [facility];  Service: General;  Laterality: Left;  left subclavian      PROBLEM LIST : has COPD (chronic obstructive pulmonary disease); Abdominal pain, acute; Collapsed lung; Squamous cell carcinoma lung; and Single Brain Metastasis on her problem list.    ALLERGIES:  is allergic to sulfa antibiotics; neosporin; aspirin; and codeine.  MEDICATIONS: Current outpatient prescriptions:albuterol (PROVENTIL) (2.5 MG/3ML) 0.083% nebulizer solution, Take 2.5 mg by nebulization every 4 (four) hours as needed. Shortness of Breath, Disp: , Rfl: ;  b complex vitamins capsule,  Take 1 capsule by mouth daily., Disp: , Rfl: ;  budesonide-formoterol (SYMBICORT) 160-4.5 MCG/ACT inhaler, Inhale 2 puffs into the lungs 2 (two) times daily., Disp: , Rfl:  Cholecalciferol (VITAMIN D PO), Take 2,000 Units by mouth daily. , Disp: , Rfl: ;  clopidogrel (PLAVIX) 75 MG tablet, Take 1 tablet (75 mg total) by mouth daily. As directed, Disp: 35 tablet, Rfl: 0;  co-enzyme Q-10 50 MG capsule, Take 50 mg by mouth daily., Disp: , Rfl: ;  Docusate Calcium (STOOL SOFTENER PO), Take 1 capsule by mouth every morning., Disp: , Rfl:  fish oil-omega-3 fatty acids 1000 MG capsule, Take 1 g by mouth every morning., Disp: , Rfl: ;  gabapentin (NEURONTIN) 300 MG capsule, Take 300 mg by mouth 3 (three) times daily., Disp: , Rfl: ;  lidocaine-prilocaine (EMLA) cream, Apply 1 application topically as needed. Apply a quarter sized amount 1 hour prior to chemo. Do not rub in. Cover with plastic., Disp: , Rfl:  LORazepam (ATIVAN) 1 MG tablet, Take 1 tablet (1 mg total) by mouth every 6 (six) hours as needed for anxiety., Disp: 30 tablet, Rfl: 1;  omeprazole (PRILOSEC) 20 MG capsule, Take 20 mg by mouth daily. Acid Reflux, Disp: , Rfl: ;  oxyCODONE-acetaminophen (PERCOCET/ROXICET) 5-325 MG per tablet, Take 1 tablet by mouth every 4 (four) hours as needed for pain., Disp: 60 tablet, Rfl: 0 polyethylene glycol powder (GLYCOLAX/MIRALAX) powder, Take 17 g by mouth daily as needed. Constipation, Disp: , Rfl: ;  tiotropium (SPIRIVA) 18 MCG inhalation capsule, Place  18 mcg into inhaler and inhale daily., Disp: , Rfl:     REVIEW OF SYSTEMS:  NO HEADACHE, DIZZINES, ABDO OR BACK PAIN, NO EDEMA, OLD STABLE NEUROPATHY PHYSICAL EXAMINATION: ECOG PERFORMANCE STATUS: PERF STATUS 2   GENERAL: No distress,  SKIN:  No rashes or bruising  HEAD: Normocephalic, No trauma EYES: Sclera and conjuntiva clear  ENT: No thrush LYMPH:  PALPABLE NODE  RIGHT ANTERIOR CERVICAL, ALSO MASS NON TENDER LEFT SUPRACLAVICULAR  LUNGS: Clear to  auscultation, no  wheezes or rhonchi HEART: Regular rate & rhythm,   ABDOMEN: Abdomen soft, non-tender, , no masses or organomegaly   MSK: MUSCLE WASTING RIGHT HAND EXTREMITIES: No lower ext edema NEURO: Alert & oriented, no gross focal weakness. PSYCH  Cooperative, mood/affect normal   LABORATORY DATA: PTT, PT, CBC, MET C, PENDING   RADIOGRAPHIC STUDIES: SEE CT SCAN 11/18/12  ASSESSMENT: PROGRESSIVE LEFT NECK MASS, NEW RIGHT CERVICAL NODE, MOST CONSISTENT WITH RECURRENCE SQUAMOUS LUNG CANCER. WITH SALVAGE TX CONSIDERED, AND HX BREAST CANCER 20 YRS AGO, PRUDENT TO BX, PATIENT ALSO WANT BX TO BE DONE.  MILD EVELATED LIVER ENZYMES LAST CHECK WOULD REPEAT. SUBACUTELY ELEVATED SERUM CALCIUM WOULD REPEAT   PLAN: 1. CHECK CHEMISTRY AND CBC, SPECIAL ATTENTION TO LFT, CRE, CALCIUM, IF CALCIUM UP REPEAT CLOSELY PLUS IONIZED CALCIUM AND PTH.  2.APPT TODAY DR Malvin Johns FOR EVALUATION BX. 3. CURRENTLY CONSIDER A GOOD CANDIDATE IF AGREEABLE FOR SALVAGE CHEMOTX WOULD FAVOR GEMZAR AND CARBO, INITIALLY DOSE ATTENUATED. 4. LATER RECONSULT RADIATION ONCOLOGY, CAN ADDITIONAL PALLIATION BE GIVEN TO NECK MASS  5. ALSO A CANDIDATE FOR VERISTRAT TESTING AND POSSIBLE PO TARCEVA LATER. 6 APPT FOR END OF NEXT WEEK F/U OR SOONER IF LABS SIGNIFICNTLY ABNORMAL, POSSIBLE LATER ROLE FOR CT LIVER AND/OR MRI BRAIN, BUT WITHOUT NEW SYMPTOMS, IF LFTS WILL BE STABLE, CAN HOLD ON ANY OTHER IMAGING    All questions were answered. The patient knows to call the clinic with any problems, questions or concerns. We can certainly see the patient much sooner if necessary.     Marin Roberts, MD 11/25/2012 11:02 AM

## 2012-11-25 NOTE — Patient Instructions (Addendum)
[facility] Discharge Instructions  RECOMMENDATIONS MADE BY THE CONSULTANT AND ANY TEST RESULTS WILL BE SENT TO YOUR REFERRING PHYSICIAN.  EXAM FINDINGS BY THE PHYSICIAN TODAY AND SIGNS OR SYMPTOMS TO REPORT TO CLINIC OR PRIMARY PHYSICIAN: Exam and discussion by Dr. Lorre Nick.  Will see you after biopsy to discuss treatment options.  MEDICATIONS PRESCRIBED:  none  INSTRUCTIONS GIVEN AND DISCUSSED: Will see you back after the biopsy to discuss treatment options.  SPECIAL INSTRUCTIONS/FOLLOW-UP: Follow-up in 10 days.  Thank you for choosing [facility] to provide your oncology and hematology care.  To afford each patient quality time with our providers, please arrive at least 15 minutes before your scheduled appointment time.  With your help, our goal is to use those 15 minutes to complete the necessary work-up to ensure our physicians have the information they need to help with your evaluation and healthcare recommendations.    Effective January 1st, 2014, we ask that you re-schedule your appointment with our physicians should you arrive 10 or more minutes late for your appointment.  We strive to give you quality time with our providers, and arriving late affects you and other patients whose appointments are after yours.    Again, thank you for choosing [facility].  Our hope is that these requests will decrease the amount of time that you wait before being seen by our physicians.       _____________________________________________________________  Should you have questions after your visit to [facility], please contact our office at [phone] between the hours of 8:30 a.m. and 5:00 p.m.  Voicemails left after 4:30 p.m. will not be returned until the following business day.  For prescription refill requests, have your pharmacy contact our office with your prescription refill request.

## 2012-11-25 NOTE — Progress Notes (Signed)
Labs drawn today for cbc/diff,cmp,pt/ptt

## 2012-11-26 NOTE — Op Note (Signed)
[name], [name] NO.:  [id number]  MEDICAL RECORD NO.:  [id number]  LOCATION:  RAD                           FACILITY:  [facility]  PHYSICIAN:  Barbaraann Barthel, M.D. DATE OF BIRTH:  1933/05/17  DATE OF PROCEDURE:  11/25/2012 DATE OF DISCHARGE:  11/18/2012                              OPERATIVE REPORT   This is a nice lady who I have known for many years, who has the diagnosis of metastatic lung carcinoma.  She is also status post treatment for carcinoma of the breast.  She presented to the Oncology Department with at least a 3-cm mass in her left neck.  I discussed this with the oncologist.  This most likely represents a lesion related to her carcinoma of the lung.  There is an off chance that this is possibly related to her breast, but this is less likely.  Considerations for chemotherapy versus tamoxifen or radiation were discussed, and the patient will discuss this further with the Oncology team.  I under aseptic conditions did a fine-needle aspirate of this mass and this was sent to pathology and will be followed up with Oncology to help determine further treatment for her.  There were no problems with the FNA and no bruising and no active bleeding.  The patient seems very tearful, and I told her to discuss this with her family and with the oncologists regarding any further treatments.     Barbaraann Barthel, M.D.     WB/MEDQ  D:  11/25/2012  T:  11/26/2012  Job:  [id number]  cc:   Oncology Department

## 2012-11-27 ENCOUNTER — Other Ambulatory Visit (HOSPITAL_COMMUNITY): Payer: Self-pay | Admitting: Oncology

## 2012-11-27 DIAGNOSIS — C3490 Malignant neoplasm of unspecified part of unspecified bronchus or lung: Secondary | ICD-10-CM

## 2012-11-27 MED ORDER — CYCLOBENZAPRINE HCL 10 MG PO TABS: 10.0000 mg | ORAL_TABLET | Freq: Two times a day (BID) | ORAL | Status: AC | PRN

## 2012-11-27 MED ORDER — OXYCODONE-ACETAMINOPHEN 5-325 MG PO TABS: 1.0000 | ORAL_TABLET | ORAL | Status: AC | PRN

## 2012-11-30 DIAGNOSIS — R221 Localized swelling, mass and lump, neck: Secondary | ICD-10-CM | POA: Insufficient documentation

## 2012-12-06 ENCOUNTER — Other Ambulatory Visit (HOSPITAL_COMMUNITY): Payer: Self-pay | Admitting: Cardiovascular Disease

## 2012-12-06 ENCOUNTER — Encounter (HOSPITAL_BASED_OUTPATIENT_CLINIC_OR_DEPARTMENT_OTHER): Payer: Medicare Other

## 2012-12-06 VITALS — BP 104/73 | HR 80 | Temp 97.4°F | Resp 20 | Wt 123.7 lb

## 2012-12-06 DIAGNOSIS — I739 Peripheral vascular disease, unspecified: Secondary | ICD-10-CM

## 2012-12-06 DIAGNOSIS — C3492 Malignant neoplasm of unspecified part of left bronchus or lung: Secondary | ICD-10-CM

## 2012-12-06 DIAGNOSIS — Z853 Personal history of malignant neoplasm of breast: Secondary | ICD-10-CM

## 2012-12-06 DIAGNOSIS — C7931 Secondary malignant neoplasm of brain: Secondary | ICD-10-CM

## 2012-12-06 DIAGNOSIS — R220 Localized swelling, mass and lump, head: Secondary | ICD-10-CM

## 2012-12-06 DIAGNOSIS — R221 Localized swelling, mass and lump, neck: Secondary | ICD-10-CM

## 2012-12-06 DIAGNOSIS — C3430 Malignant neoplasm of lower lobe, unspecified bronchus or lung: Secondary | ICD-10-CM

## 2012-12-06 MED ORDER — OXYCODONE HCL 5 MG PO TABS: 5.0000 mg | ORAL_TABLET | Freq: Four times a day (QID) | ORAL | Status: DC

## 2012-12-06 MED ORDER — BENZONATATE 100 MG PO CAPS: 100.0000 mg | ORAL_CAPSULE | Freq: Four times a day (QID) | ORAL | Status: AC

## 2012-12-06 NOTE — Progress Notes (Signed)
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 Lung Cancer  . Breast Cancer  . Mass    CURRENT THERAPY:None  INTERVAL HISTORY: [name] 79 y.o. female returns for followup after undergoing a fine-needle aspiration of a left supraclavicular mass on 11/25/2012. She complains of pain in that region along with a left shoulder. She also has severe paroxysms of cough without expectoration. She denies any dysphagia, abdominal pain, nausea, vomiting, lower extremity swelling or redness, PND, orthopnea, palpitations, constipation, diarrhea, melena, hematochezia, hematuria, or vaginal bleeding. She also denies any headache but it is hard of hearing.   MEDICAL HISTORY: Past Medical History  Diagnosis Date  . COPD (chronic obstructive pulmonary disease)   . Peripheral vascular disease   . Hilar mass     left lung  . Hypothyroidism   . S/P radiation therapy lung    35 years ago at [facility] per patient report  . S/P radiation therapy 12/25/11 Brain    Left Parietal Lobe/ 20 Gray/ 1 Fraction  . Status post chemotherapy Complete    6 cycles of Carboplatinum/ Alimta  . Peripheral neuropathy noted 04/08/12    Right Hand grade 2 0r 3 / Left hand grade 1/ Feet grade 1  . Unilateral complete paralysis of vocal cord     LEFT  . Cancer     breast  . Breast cancer     rt  . Squamous cell carcinoma lung     breast     INTERIM HISTORY: has COPD (chronic obstructive pulmonary disease); Abdominal pain, acute; Collapsed lung; Squamous cell carcinoma lung; Single Brain Metastasis; Mass in neck; and History of breast cancer on her problem list.    ALLERGIES:  is allergic to sulfa  antibiotics; neosporin; aspirin; and codeine.  MEDICATIONS: has a current medication list which includes the following prescription(s): albuterol, b complex vitamins, budesonide-formoterol, cholecalciferol, clopidogrel, co-enzyme q-10, cyanocobalamin, cyclobenzaprine, docusate calcium, ferrous sulfate, fish oil-omega-3 fatty acids, gabapentin, omeprazole, oxycodone-acetaminophen, pravastatin, thiamine, tiotropium, benzonatate, lidocaine-prilocaine, lorazepam, oxycodone, and polyethylene glycol powder.  SURGICAL HISTORY:  Past Surgical History  Procedure Laterality Date  . Mastectomy      rt side  . Abdominal hysterectomy    . Flexible bronchoscopy  11/09/2011    Procedure: FLEXIBLE BRONCHOSCOPY;  Surgeon: Fredirick Maudlin, MD;  Location: [facility];  Service: Pulmonary;  Laterality: Left;  . Thyroidectomy    . External ear surgery    . Neck mass excision      1960's  . Femoral artery stent  2009  . Portacath placement  12/26/2011    Procedure: INSERTION PORT-A-CATH;  Surgeon: Marlane Hatcher, MD;  Location: [facility];  Service: General;  Laterality: Left;  left subclavian    FAMILY HISTORY: family history includes Cancer in her brother and sister; Clotting disorder in her brother.  SOCIAL HISTORY:  reports that she has quit smoking. Her smoking use included Cigarettes. She has a 25 pack-year smoking history. She has quit using smokeless tobacco. Her smokeless tobacco use included Snuff. She reports that she does not drink alcohol or use illicit drugs.  REVIEW OF SYSTEMS:  Other than that discussed above is noncontributory.  PHYSICAL EXAMINATION: ECOG  PERFORMANCE STATUS: 2 - Symptomatic, <50% confined to bed  Blood pressure 104/73, pulse 80, temperature 97.4 F (36.3 C), temperature source Oral, resp. rate 20, weight 123 lb 11.2 oz (56.11 kg), SpO2 96.00%.  GENERAL:alert, no distress and comfortable SKIN: skin color, texture, turgor are normal, no rashes or significant lesions EYES:  normal, Conjunctiva are pink and non-injected, sclera clear OROPHARYNX:no exudate, no erythema and lips, buccal mucosa, and tongue normal  NECK: supple, thyroid normal size, non-tender, without nodularity CHEST: Increased AP diameter with scattered expiratory crackles bilaterally. LYMPH:  no palpable lymphadenopathy in the cervical, axillary or inguinal LUNGS: Scattered expiratory crackles bilaterally to t HEART: regular rate & rhythm and no murmurs and no lower extremity edema ABDOMEN:abdomen soft, non-tender and normal bowel sounds Musculoskeletal:no cyanosis of digits and no clubbing  NEURO: alert & oriented x 3 with fluent speech, no focal motor/sensory deficits   LABORATORY DATA: Office Visit on 11/25/2012  Component Date Value Range Status  . Prothrombin Time 11/25/2012 12.7  11.6 - 15.2 seconds Final  . INR 11/25/2012 0.97  0.00 - 1.49 Final  . aPTT 11/25/2012 31  24 - 37 seconds Final  Infusion on 11/25/2012  Component Date Value Range Status  . WBC 11/25/2012 5.0  4.0 - 10.5 K/uL Final  . RBC 11/25/2012 3.72* 3.87 - 5.11 MIL/uL Final  . Hemoglobin 11/25/2012 11.6* 12.0 - 15.0 g/dL Final  . HCT 16/12/9602 36.2  36.0 - 46.0 % Final  . MCV 11/25/2012 97.3  78.0 - 100.0 fL Final  . MCH 11/25/2012 31.2  26.0 - 34.0 pg Final  . MCHC 11/25/2012 32.0  30.0 - 36.0 g/dL Final  . RDW 54/11/8117 14.7  11.5 - 15.5 % Final  . Platelets 11/25/2012 185  150 - 400 K/uL Final  . Neutrophils Relative % 11/25/2012 63  43 - 77 % Final  . Neutro Abs 11/25/2012 3.1  1.7 - 7.7 K/uL Final  . Lymphocytes Relative 11/25/2012 24  12 - 46 % Final  . Lymphs Abs 11/25/2012 1.2  0.7 - 4.0 K/uL Final  . Monocytes Relative 11/25/2012 11  3 - 12 % Final  . Monocytes Absolute 11/25/2012 0.5  0.1 - 1.0 K/uL Final  . Eosinophils Relative 11/25/2012 2  0 - 5 % Final  . Eosinophils Absolute 11/25/2012 0.1  0.0 - 0.7 K/uL Final  . Basophils Relative 11/25/2012 1  0 - 1 % Final  . Basophils Absolute 11/25/2012  0.0  0.0 - 0.1 K/uL Final  . Sodium 11/25/2012 136  135 - 145 mEq/L Final  . Potassium 11/25/2012 3.9  3.5 - 5.1 mEq/L Final  . Chloride 11/25/2012 99  96 - 112 mEq/L Final  . CO2 11/25/2012 29  19 - 32 mEq/L Final  . Glucose, Bld 11/25/2012 85  70 - 99 mg/dL Final  . BUN 14/78/2956 16  6 - 23 mg/dL Final  . Creatinine, Ser 11/25/2012 0.96  0.50 - 1.10 mg/dL Final  . Calcium 21/30/8657 10.5  8.4 - 10.5 mg/dL Final  . Total Protein 11/25/2012 7.1  6.0 - 8.3 g/dL Final  . Albumin 84/69/6295 3.9  3.5 - 5.2 g/dL Final  . AST 28/41/3244 30  0 - 37 U/L Final  . ALT 11/25/2012 15  0 - 35 U/L Final  . Alkaline Phosphatase 11/25/2012 58  39 - 117 U/L Final  . Total Bilirubin 11/25/2012 0.3  0.3 - 1.2 mg/dL Final  . GFR calc non Af Amer 11/25/2012 56* >90 mL/min Final  .  GFR calc Af Amer 11/25/2012 64* >90 mL/min Final   Comment: (NOTE)                          The eGFR has been calculated using the CKD EPI equation.                          This calculation has not been validated in all clinical situations.                          eGFR's persistently <90 mL/min signify possible Chronic Kidney                          Disease.  Infusion on 11/15/2012  Component Date Value Range Status  . WBC 11/15/2012 6.6  4.0 - 10.5 K/uL Final  . RBC 11/15/2012 3.99  3.87 - 5.11 MIL/uL Final  . Hemoglobin 11/15/2012 12.7  12.0 - 15.0 g/dL Final  . HCT 16/12/9602 37.8  36.0 - 46.0 % Final  . MCV 11/15/2012 94.7  78.0 - 100.0 fL Final  . MCH 11/15/2012 31.8  26.0 - 34.0 pg Final  . MCHC 11/15/2012 33.6  30.0 - 36.0 g/dL Final  . RDW 54/11/8117 14.4  11.5 - 15.5 % Final  . Platelets 11/15/2012 192  150 - 400 K/uL Final  . Neutrophils Relative % 11/15/2012 71  43 - 77 % Final  . Neutro Abs 11/15/2012 4.7  1.7 - 7.7 K/uL Final  . Lymphocytes Relative 11/15/2012 18  12 - 46 % Final  . Lymphs Abs 11/15/2012 1.2  0.7 - 4.0 K/uL Final  . Monocytes Relative 11/15/2012 9  3 - 12 % Final  . Monocytes Absolute  11/15/2012 0.6  0.1 - 1.0 K/uL Final  . Eosinophils Relative 11/15/2012 2  0 - 5 % Final  . Eosinophils Absolute 11/15/2012 0.1  0.0 - 0.7 K/uL Final  . Basophils Relative 11/15/2012 0  0 - 1 % Final  . Basophils Absolute 11/15/2012 0.0  0.0 - 0.1 K/uL Final  . Sodium 11/15/2012 131* 135 - 145 mEq/L Final  . Potassium 11/15/2012 4.2  3.5 - 5.1 mEq/L Final  . Chloride 11/15/2012 94* 96 - 112 mEq/L Final  . CO2 11/15/2012 28  19 - 32 mEq/L Final  . Glucose, Bld 11/15/2012 95  70 - 99 mg/dL Final  . BUN 14/78/2956 12  6 - 23 mg/dL Final  . Creatinine, Ser 11/15/2012 1.08  0.50 - 1.10 mg/dL Final  . Calcium 21/30/8657 10.8* 8.4 - 10.5 mg/dL Final  . Total Protein 11/15/2012 7.5  6.0 - 8.3 g/dL Final  . Albumin 84/69/6295 4.1  3.5 - 5.2 g/dL Final  . AST 28/41/3244 38* 0 - 37 U/L Final  . ALT 11/15/2012 17  0 - 35 U/L Final  . Alkaline Phosphatase 11/15/2012 60  39 - 117 U/L Final  . Total Bilirubin 11/15/2012 0.6  0.3 - 1.2 mg/dL Final  . GFR calc non Af Amer 11/15/2012 48* >90 mL/min Final  . GFR calc Af Amer 11/15/2012 56* >90 mL/min Final   Comment: (NOTE)                          The eGFR has been calculated using the CKD EPI equation.  This calculation has not been validated in all clinical situations.                          eGFR's persistently <90 mL/min signify possible Chronic Kidney                          Disease.     Urinalysis    Component Value Date/Time   COLORURINE STRAW* 10/30/2011 2328   APPEARANCEUR CLEAR 10/30/2011 2328   LABSPEC <1.005* 10/30/2011 2328   PHURINE 6.5 10/30/2011 2328   GLUCOSEU NEGATIVE 10/30/2011 2328   HGBUR NEGATIVE 10/30/2011 2328   BILIRUBINUR NEGATIVE 10/30/2011 2328   KETONESUR NEGATIVE 10/30/2011 2328   PROTEINUR NEGATIVE 10/30/2011 2328   UROBILINOGEN 0.2 10/30/2011 2328   NITRITE NEGATIVE 10/30/2011 2328   LEUKOCYTESUR NEGATIVE 10/30/2011 2328    RADIOGRAPHIC STUDIES: Ct Chest W Contrast  11/18/2012   *RADIOLOGY  REPORT*  Clinical Data: Restaging of stage IV lung carcinoma.  Follow-up of left lung changes.  Status post radiation to the left lung.  CT CHEST WITH CONTRAST  Technique:  Multidetector CT imaging of the chest was performed following the standard protocol during bolus administration of intravenous contrast.  Contrast: 80mL OMNIPAQUE IOHEXOL 300 MG/ML  SOLN  Comparison: 07/16/2012  Findings: Focal opacity and bronchiectasis in the left mid lung adjacent to the retracted oblique fissure is essentially stable from prior study consistent with post radiation fibrosis.  Triangular shaped focal opacity at the right apex is stable.  There are areas of coarse reticular and reticular nodular type opacity seen the in a heterogeneous distribution peripherally in both lungs consistent with interstitial fibrosis.  This is also stable.  No new lung masses or focal opacities.  No pleural effusion.  Mass at the left neck base contiguous with the left internal jugular vein measures 3.6 cm in greatest transverse dimension. This is significantly larger than on the prior exam.  No other neck base masses.  No mediastinal or axillary masses or pathologically enlarged lymph nodes are seen.  There is a borderline enlarged subcarinal lymph node measuring 1 cm short axis, stable.  Limited evaluation of the upper abdomen shows no evidence of metastatic disease.  No osteoblastic or osteolytic lesions.  IMPRESSION: Left neck base mass has enlarged when compared the prior exam consistent with a metastatic lymph node.  It measures 3.6 cm in greatest transverse dimension.  No other change from the prior study.  Left mid lung opacity with associated bronchiectasis is consistent with post radiation fibrosis.  Triangular shaped focal opacity at the right apex is stable and most likely scarring.  Other areas of interstitial fibrosis are noted bilaterally and peripherally, that are also stable.  No other evidence of metastatic disease.   Original  Report Authenticated By: Amie Portland, M.D.    ASSESSMENT: #1. Metastatic poorly differentiated carcinoma involving the left subclavicular area producing brachioplexopathy. #2. History of squamous cell carcinoma lung, treated with chemotherapy and radiation including external beam radiation for a solitary brain metastasis in August of 2013. She also received Taxol and carboplatin for 5 cycles completed 03/21/2012. She's had no therapy since. #3. Chronic cough with chronic obstructive pulmonary disease. #4. Remote history of breast cancer, no evidence of disease.   PLAN: #1. Recommend local radiotherapy to the left subclavicular area in an attempt to control symptoms. Alternatives would include chemotherapy or combined modality therapy. It was also suggested that the tissue be  sent for molecular analysis to determine whether or not EGFR mutations are present or whether there are any other molecular characteristics which would predict for treatment with an innovative intervention. The patient is interested in any active therapy. Patient also refuses any further systemic staging. #2. She was placed oxycodone 5 mg every 6 hours around the clock and was told to take stool softeners on a regular basis and to use Senokot S. if in fact the stool softeners are inadequate to control bowel movements. #3. She was started on Tessalon Perles 100 mg 4 times a day to control chronic cough. #4. She was referred for hospice care but was told to call back at any time if she decided to change her mind and go from therapy.   All questions were answered. The patient knows to call the clinic with any problems, questions or concerns. We can certainly see the patient much sooner if necessary.   I spent 40 minutes counseling the patient face to face. The total time spent in the appointment was 40 minutes.    Maurilio Lovely, MD 12/06/2012 10:41 AM

## 2012-12-06 NOTE — Patient Instructions (Addendum)
[facility] Discharge Instructions  RECOMMENDATIONS MADE BY THE CONSULTANT AND ANY TEST RESULTS WILL BE SENT TO YOUR REFERRING PHYSICIAN.  EXAM FINDINGS BY THE PHYSICIAN TODAY AND SIGNS OR SYMPTOMS TO REPORT TO CLINIC OR PRIMARY PHYSICIAN:   We are sending your biopsy tissue sampling off for further testing to see what potentially could work against this cancer if you change your mind and want treatment.   We are not scheduling you for a follow up appointment with [place].   We are referring you to Hospice. If you change your mind - just call us and we will get you back into see [place].  Tessalon tablets. This is for your cough. Take as directed. He wants you to take one Tessalon tablet before bedtime because this will also help you sleep better per Dr. Zigmund Daniel.   Oxycodone 1 tablet every 6 hours for pain. Dr. Zigmund Daniel wants you to take this medication around the clock so that you don't get in pain. He wants your pain controlled.   Take Miralax daily so that the bowels don't get "plugged up"  Take your Colace as you have been doing.    Thank you for choosing [facility] to provide your oncology and hematology care.  To afford each patient quality time with our providers, please arrive at least 15 minutes before your scheduled appointment time.  With your help, our goal is to use those 15 minutes to complete the necessary work-up to ensure our physicians have the information they need to help with your evaluation and healthcare recommendations.    Effective January 1st, 2014, we ask that you re-schedule your appointment with our physicians should you arrive 10 or more minutes late for your appointment.  We strive to give you quality time with our providers, and arriving late affects you and other patients whose appointments are after yours.    Again, thank you for choosing [facility].  Our hope is that these requests will decrease the amount of time  that you wait before being seen by our physicians.       _____________________________________________________________  Should you have questions after your visit to [facility], please contact our office at [phone] between the hours of 8:30 a.m. and 5:00 p.m.  Voicemails left after 4:30 p.m. will not be returned until the following business day.  For prescription refill requests, have your pharmacy contact our office with your prescription refill request.

## 2012-12-09 ENCOUNTER — Encounter: Payer: Self-pay | Admitting: *Deleted

## 2012-12-10 ENCOUNTER — Ambulatory Visit (HOSPITAL_COMMUNITY)
Admission: RE | Admit: 2012-12-10 | Discharge: 2012-12-10 | Disposition: A | Discharge status: Home / Self Care | Payer: Medicare Other | Source: Ambulatory Visit | Attending: Cardiovascular Disease | Admitting: Cardiovascular Disease

## 2012-12-10 DIAGNOSIS — I739 Peripheral vascular disease, unspecified: Secondary | ICD-10-CM | POA: Insufficient documentation

## 2012-12-10 DIAGNOSIS — I6529 Occlusion and stenosis of unspecified carotid artery: Secondary | ICD-10-CM

## 2012-12-10 NOTE — Progress Notes (Signed)
Carotid Duplex Completed. Liridona Mashaw, BS, RDMS, RVT  

## 2012-12-16 ENCOUNTER — Ambulatory Visit (INDEPENDENT_AMBULATORY_CARE_PROVIDER_SITE_OTHER): Payer: Medicare Other | Admitting: Cardiovascular Disease

## 2012-12-16 ENCOUNTER — Encounter: Payer: Self-pay | Admitting: Cardiovascular Disease

## 2012-12-16 ENCOUNTER — Ambulatory Visit: Payer: Medicare Other | Admitting: Cardiovascular Disease

## 2012-12-16 VITALS — BP 130/70 | HR 60 | Ht 64.0 in | Wt 124.8 lb

## 2012-12-16 DIAGNOSIS — I739 Peripheral vascular disease, unspecified: Secondary | ICD-10-CM | POA: Insufficient documentation

## 2012-12-16 DIAGNOSIS — I10 Essential (primary) hypertension: Secondary | ICD-10-CM

## 2012-12-16 DIAGNOSIS — E785 Hyperlipidemia, unspecified: Secondary | ICD-10-CM | POA: Insufficient documentation

## 2012-12-16 NOTE — Patient Instructions (Addendum)
Stop PLAVIX- CLOPIDOGREL Your physician wants you to follow-up in 19 MONTH Dr Allyson Sabal.  You will receive a reminder letter in the mail two months in advance. If you don't receive a letter, please call our office to schedule the follow-up appointment.

## 2012-12-16 NOTE — Assessment & Plan Note (Signed)
Controlled on current medications 

## 2012-12-16 NOTE — Progress Notes (Signed)
12/16/2012 [name]   [birth year]  [id number]  Primary Physician Jennifer Ribas, MD Primary Cardiologist: Jennifer Gess MD [name]   HPI:  The patient is a 79 year old, thin and frail appearing, divorced Caucasian female, mother of 4, grandmother to 6 grandchildren, She is accompanied by her son today. I last saw her in the office 08/31/11.She has a history of PAD status post right common iliac artery PTA and stenting by me February 03, 2008 with subsequent normalization of her Dopplers and resolution of her claudication symptoms. Other problems include hypertension, hyperlipidemia and a strong family history for heart disease. She had a Myoview and echo performed January 10, 2008, all of which were normal. She has recently been diagnosed with "COPD" and has significant shortness of breath. Recent lower extremity Dopplers performed this month revealed a patent iliac stent with ABIs of greater than 1 bilaterally.she denied chest pain or shortness of breath. She currently emulates with the aid of a walker. She been diagnosed with squamous cell cancer of the lung and has had chemotherapy and radiation therapy complicated by peripheral neuropathy.    Current Outpatient Prescriptions  Medication Sig Dispense Refill  . albuterol (PROVENTIL) (2.5 MG/3ML) 0.083% nebulizer solution Take 2.5 mg by nebulization every 4 (four) hours as needed. Shortness of Breath      . b complex vitamins capsule Take 1 capsule by mouth daily.      . benzonatate (TESSALON) 100 MG capsule Take 1 capsule (100 mg total) by mouth 4 (four) times daily.  100 capsule  3  . budesonide-formoterol (SYMBICORT) 160-4.5 MCG/ACT inhaler Inhale 2 puffs into the lungs 2 (two) times daily.      . Cholecalciferol (VITAMIN D PO) Take 2,000 Units by mouth daily.       . clopidogrel (PLAVIX) 75 MG tablet Take 1 tablet (75 mg total) by mouth daily. As directed  35 tablet  0  . co-enzyme Q-10 50 MG capsule Take 50  mg by mouth daily.      . Cyanocobalamin (VITAMIN B 12 PO) Take 1 tablet by mouth daily.      . cyclobenzaprine (FLEXERIL) 10 MG tablet Take 1 tablet (10 mg total) by mouth 2 (two) times daily as needed for muscle spasms.  20 tablet  0  . Docusate Calcium (STOOL SOFTENER PO) Take 2 capsules by mouth daily.       . ferrous sulfate 325 (65 FE) MG tablet Take 325 mg by mouth daily.      . fish oil-omega-3 fatty acids 1000 MG capsule Take 1 g by mouth every morning.      . gabapentin (NEURONTIN) 300 MG capsule Take 300 mg by mouth 3 (three) times daily.      Marland Kitchen lidocaine-prilocaine (EMLA) cream Apply 1 application topically as needed. Apply a quarter sized amount 1 hour prior to chemo. Do not rub in. Cover with plastic.      Marland Kitchen LORazepam (ATIVAN) 1 MG tablet Take 1 tablet (1 mg total) by mouth every 6 (six) hours as needed for anxiety.  30 tablet  1  . omeprazole (PRILOSEC) 20 MG capsule Take 20 mg by mouth daily. Acid Reflux      . oxyCODONE (OXY IR/ROXICODONE) 5 MG immediate release tablet Take 1 tablet (5 mg total) by mouth every 6 (six) hours.  100 tablet  0  . oxyCODONE-acetaminophen (PERCOCET/ROXICET) 5-325 MG per tablet Take 1 tablet by mouth every 4 (four) hours as needed for pain.  60 tablet  0  . polyethylene glycol powder (GLYCOLAX/MIRALAX) powder Take 17 g by mouth daily as needed. Constipation      . pravastatin (PRAVACHOL) 20 MG tablet Take 40 mg by mouth daily.       Marland Kitchen thiamine (VITAMIN B-1) 100 MG tablet Take 100 mg by mouth daily.      Marland Kitchen tiotropium (SPIRIVA) 18 MCG inhalation capsule Place 18 mcg into inhaler and inhale daily.       No current facility-administered medications for this visit.    Allergies  Allergen Reactions  . Sulfa Antibiotics Hives and Swelling    REACTION: Severe swelling and hives. Patient was hospitalized  . Neosporin [Neomycin-Bacitracin Zn-Polymyx] Rash    Redness, Irritation   . Aspirin     CAN'T TAKE DUE TO BLEEDING IN STOMACH  . Codeine Other (See  Comments)    REACTION: Fainting    History   Social History  . Marital Status: Divorced    Spouse Name: N/A    Number of Children: N/A  . Years of Education: N/A   Occupational History  . Not on file.   Social History Main Topics  . Smoking status: Former Smoker -- 1.00 packs/day for 25 years    Types: Cigarettes  . Smokeless tobacco: Former Neurosurgeon    Types: Snuff  . Alcohol Use: No  . Drug Use: No  . Sexual Activity: Not on file   Other Topics Concern  . Not on file   Social History Narrative  . No narrative on file     Review of Systems: General: negative for chills, fever, night sweats or weight changes.  Cardiovascular: negative for chest pain, dyspnea on exertion, edema, orthopnea, palpitations, paroxysmal nocturnal dyspnea or shortness of breath Dermatological: negative for rash Respiratory: negative for cough or wheezing Urologic: negative for hematuria Abdominal: negative for nausea, vomiting, diarrhea, bright red blood per rectum, melena, or hematemesis Neurologic: negative for visual changes, syncope, or dizziness All other systems reviewed and are otherwise negative except as noted above.    Blood pressure 130/70, pulse 60, height 5\' 4"  (1.626 m), weight 124 lb 12.8 oz (56.609 kg).  General appearance: alert and no distress Neck: no adenopathy, no carotid bruit, no JVD, supple, symmetrical, trachea midline and thyroid not enlarged, symmetric, no tenderness/mass/nodules Lungs: clear to auscultation bilaterally Heart: regular rate and rhythm, S1, S2 normal, no murmur, click, rub or gallop Extremities: extremities normal, atraumatic, no cyanosis or edema  EKG not performed today  ASSESSMENT AND PLAN:   Peripheral vascular disease Status post right common iliac artery PTA and stenting by myself 02/03/08. Followup lower extremity arterial Dopplers performed 08/18/11 revealed ABIs of greater than 1 bilaterally with a patent right common iliac artery stent. She  denies claudication.  Essential hypertension Controlled on current medications  Hyperlipidemia On statin therapy followed by her primary care physician      Jennifer Gess MD [facility], [facility] 12/16/2012 9:35 AM

## 2012-12-16 NOTE — Assessment & Plan Note (Signed)
Status post right common iliac artery PTA and stenting by myself 02/03/08. Followup lower extremity arterial Dopplers performed 08/18/11 revealed ABIs of greater than 1 bilaterally with a patent right common iliac artery stent. She denies claudication.

## 2012-12-16 NOTE — Assessment & Plan Note (Signed)
On statin therapy followed by her primary care physician 

## 2012-12-18 NOTE — Progress Notes (Signed)
Quick Note:  Results released into mychart. ______ 

## 2012-12-23 ENCOUNTER — Other Ambulatory Visit (HOSPITAL_COMMUNITY): Payer: Self-pay | Admitting: Oncology

## 2012-12-23 DIAGNOSIS — C3490 Malignant neoplasm of unspecified part of unspecified bronchus or lung: Secondary | ICD-10-CM

## 2012-12-23 MED ORDER — LORAZEPAM 1 MG PO TABS: 1.0000 mg | ORAL_TABLET | Freq: Four times a day (QID) | ORAL | Status: DC | PRN

## 2012-12-26 ENCOUNTER — Ambulatory Visit: Payer: Medicare Other | Admitting: Cardiovascular Disease

## 2012-12-27 ENCOUNTER — Other Ambulatory Visit: Payer: Medicare Other

## 2012-12-30 ENCOUNTER — Ambulatory Visit: Payer: Medicare Other | Admitting: Radiation Oncology

## 2012-12-31 ENCOUNTER — Ambulatory Visit: Payer: Medicare Other | Admitting: Radiation Oncology

## 2013-01-16 ENCOUNTER — Other Ambulatory Visit (HOSPITAL_COMMUNITY): Payer: Self-pay | Admitting: Oncology

## 2013-01-16 ENCOUNTER — Other Ambulatory Visit: Payer: Self-pay

## 2013-01-16 DIAGNOSIS — C3490 Malignant neoplasm of unspecified part of unspecified bronchus or lung: Secondary | ICD-10-CM

## 2013-01-16 MED ORDER — LORAZEPAM 1 MG PO TABS: 1.0000 mg | ORAL_TABLET | Freq: Four times a day (QID) | ORAL | Status: AC | PRN

## 2013-01-21 ENCOUNTER — Other Ambulatory Visit (HOSPITAL_COMMUNITY): Payer: Self-pay | Admitting: Oncology

## 2013-01-21 DIAGNOSIS — C3490 Malignant neoplasm of unspecified part of unspecified bronchus or lung: Secondary | ICD-10-CM

## 2013-01-21 DIAGNOSIS — C7931 Secondary malignant neoplasm of brain: Secondary | ICD-10-CM

## 2013-01-21 MED ORDER — OXYCODONE HCL 5 MG PO TABS: 5.0000 mg | ORAL_TABLET | Freq: Four times a day (QID) | ORAL | Status: AC

## 2013-03-10 ENCOUNTER — Encounter: Payer: Self-pay | Admitting: Cardiovascular Disease

## 2013-03-11 ENCOUNTER — Telehealth: Payer: Self-pay

## 2013-03-11 NOTE — Telephone Encounter (Signed)
Patient past away @ [address] of [address]. Per Ileene Hutchinson in [employer]

## 2013-03-13 DEATH — deceased

## 2013-08-21 IMAGING — PT NM PET TUM IMG INITIAL (PI) SKULL BASE T - THIGH
5 series · 25 of 25 positions shown · non-contrast
Comparison: Chest CT 10/31/2011.  Abdomen and pelvis CT
10/30/2011.

CLINICAL DATA: Initial treatment strategy for lung mass.

NUCLEAR MEDICINE PET SKULL BASE TO THIGH
Fasting Blood Glucose:  92
TECHNIQUE: 18.1 mCi F-18 FDG was injected intravenously. CT data
was obtained and used for attenuation correction and anatomic
localization only.  (This was not acquired as a diagnostic CT
examination.) Additional exam technical data entered on
technologist worksheet.

[Series 1: pet ac · axial · 3.3mm · 4.69mm/px · z∈[-899,-29]mm · 8 of 267 slices shown]
[im 1/267]
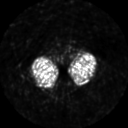
[im 39/267]
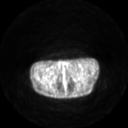
[im 77/267]
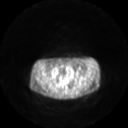
[im 115/267]
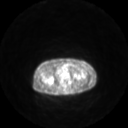
[im 153/267]
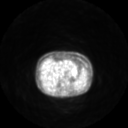
[im 191/267]
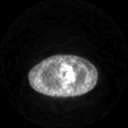
[im 229/267]
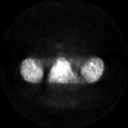
[im 267/267]
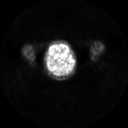

[Series 2: pet nac · axial · 3.3mm · 4.69mm/px · z∈[-899,-29]mm · 7 of 267 slices shown]
[im 1/267]
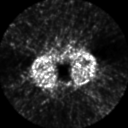
[im 45/267]
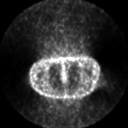
[im 89/267]
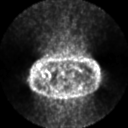
[im 134/267]
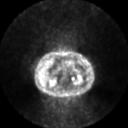
[im 178/267]
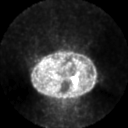
[im 222/267]
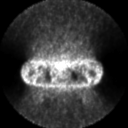
[im 267/267]
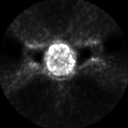

[Series 2: ct images · axial · 3.8mm · 0.98mm/px · z∈[-899,-30]mm · 7 of 267 slices shown]
[im 1/267]
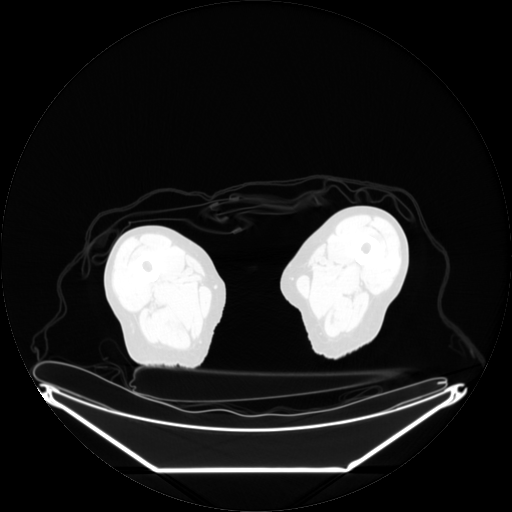
[im 45/267]
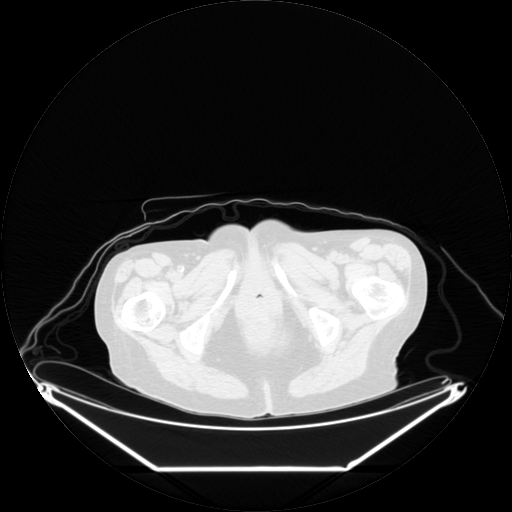
[im 89/267]
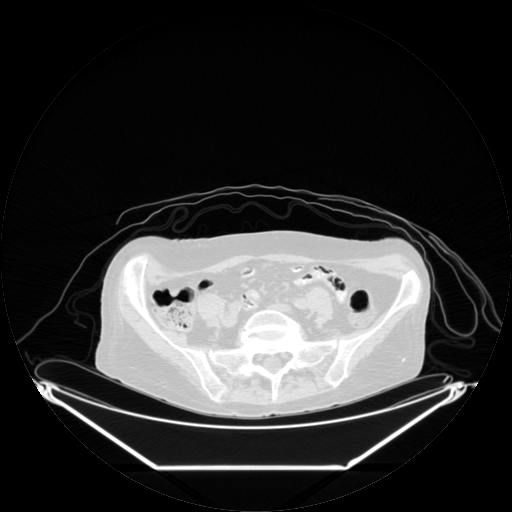
[im 134/267]
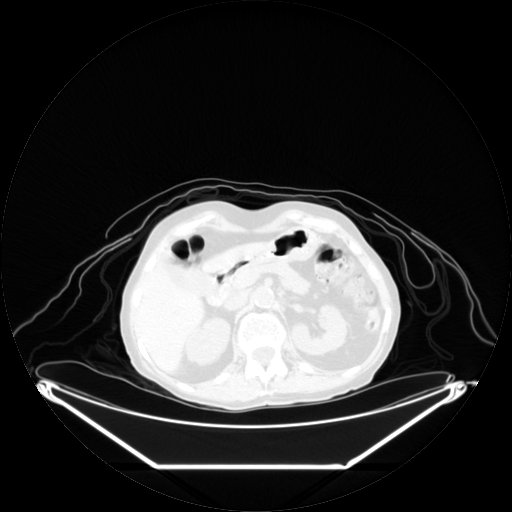
[im 178/267]
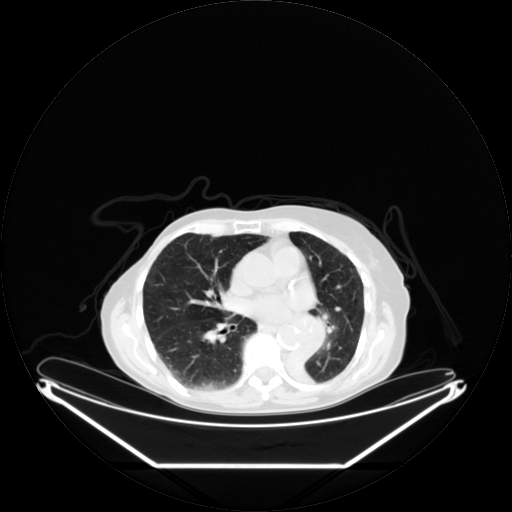
[im 222/267]
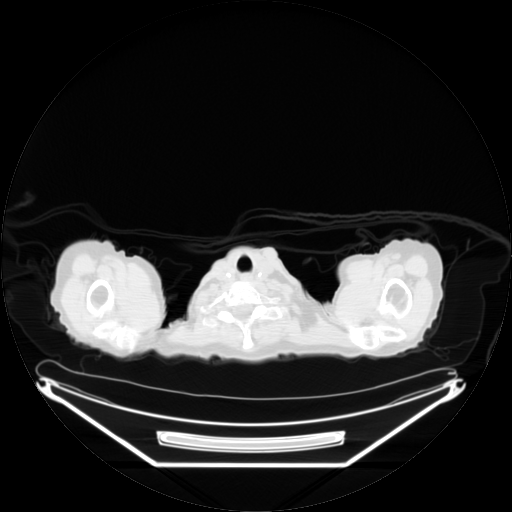
[im 267/267  brain]
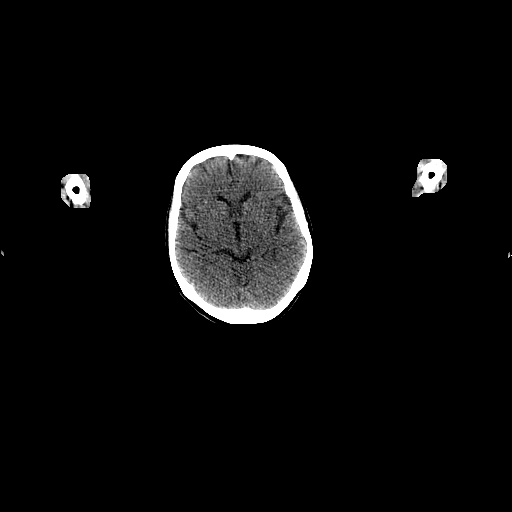

[Series 123: mip · coronal · 3.3mm · 4.69mm/px · 1 of 30 slices shown]
[im 1/30]
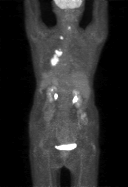

[Series 153: reformatted · coronal · 4.7mm · 6.98mm/px · 2 of 63 slices shown]
[im 1/63]
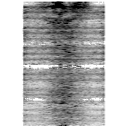
[im 63/63]
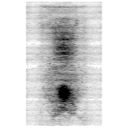

[25 of 25 positions shown; findings below may reference images not displayed]

FINDINGS: Neck: No hypermetabolic lymph nodes in the neck.  However, there is
very focal hypermetabolic activity (SUVmax = 12.7) associated with
the right side of the vocal cords (without a definable mass like
area).

Chest:  New enlarged left supraclavicular lymph node measuring 17
mm in short axis is hypermetabolic (SUVmax = 14.6)several enlarged
and hypermetabolic prevascular lymph nodes, the largest of which
mass in the AP window measuring approximately 2.8 x 2.1 cm (SUVmax
= 19.2).  Large left lower lobe mass in the perihilar region is
difficult to discretely visualized on the noncontrast CT
examination, but measures approximately 2.8 x 4.1 cm and is
markedly hypermetabolic (SUVmax = 23.4). This mass causes
obstruction with complete collapse of the left lower lobe.  There
is a trace amount of left-sided pleural fluid.  Some mild
peripheral subpleural reticulation is noted throughout portions of
the left upper lobe and right lower lobe.  There is an ill-defined
focus of ground-glass attenuation in the posterior aspect of the
left upper lobe which measures approximately 13 mm and is similar
to the recent prior study.  There is also a small 8 mm spiculated
nodule in the apex of the right upper lobe which appears to have
increased slightly in size compared to the prior examination.  No
definite hypermetabolism identified associated with this nodule.
Background of mild paraseptal emphysema. Heart size is borderline
enlarged. There is atherosclerosis of the thoracic aorta, the great
vessels of the mediastinum and the coronary arteries, including
calcified atherosclerotic plaque in the left main, left anterior
descending, left circumflex and right coronary arteries.  Status
post right-sided modified radical mastectomy and axillary nodal
dissection, without evidence of focal soft tissue mass, right
axillary adenopathy or hypermetabolism in these areas to suggest
local recurrence of disease.

Abdomen/Pelvis:  No abnormal hypermetabolic activity within the
liver, pancreas, adrenal glands, or spleen.  No hypermetabolic
lymph nodes in the abdomen or pelvis. 2.7 cm right renal lesion is
low with attenuation and likely represents a cyst.  Extensive
atherosclerosis of the abdominal and pelvic vasculature, without
definite aneurysm.  Status post total abdominal hysterectomy and
bilateral salpingo-oophorectomy.

Skeleton:  No focal hypermetabolic activity to suggest skeletal
metastasis.
IMPRESSION: 1.  Large left lower lobe mass measuring approximately 4.1 x 2.8 cm
with ipsilateral hilar, mediastinal and supraclavicular
lymphadenopathy, in addition to the contralateral right upper lobe
pulmonary nodule which has a suspicious appearance and is
enlarging.  In addition, there is abnormal hypermetabolism in the
right side of the vocal cord apparatus (without a definable mass).
At the very least, assuming that this represents a primary lung
cancer these findings are concerning for T2a, N3 disease, and
possibly M1 disease (i.e., at least stage IIIB disease, if not
stage IV).
2.  There is a possibility that the vocal cords activity is
physiologic, however, the high level hypermetabolism and asymmetry
is highly unusual.  This should be ammenable to direct inspection.
3.  No findings to suggest distant metastatic disease to the
abdomen or pelvis.
4. Atherosclerosis, including left main and three-vessel coronary
artery disease.
5.  Additional incidental findings, as above.
